# Patient Record
Sex: Male | Born: 1960
Health system: Southern US, Community
[De-identification: ages and names within clinical notes are randomized; demographics above are authoritative.]

## PROBLEM LIST (undated history)

## (undated) DIAGNOSIS — R310 Gross hematuria: Secondary | ICD-10-CM

## (undated) DIAGNOSIS — R079 Chest pain, unspecified: Secondary | ICD-10-CM

## (undated) DIAGNOSIS — E7800 Pure hypercholesterolemia, unspecified: Secondary | ICD-10-CM

## (undated) DIAGNOSIS — N201 Calculus of ureter: Secondary | ICD-10-CM

## (undated) DIAGNOSIS — N200 Calculus of kidney: Secondary | ICD-10-CM

## (undated) DIAGNOSIS — I2699 Other pulmonary embolism without acute cor pulmonale: Secondary | ICD-10-CM

## (undated) DIAGNOSIS — I82409 Acute embolism and thrombosis of unspecified deep veins of unspecified lower extremity: Secondary | ICD-10-CM

## (undated) HISTORY — DX: Gross hematuria: R31.0

## (undated) HISTORY — DX: Calculus of ureter: N20.1

## (undated) HISTORY — DX: Morbid (severe) obesity due to excess calories: E66.01

## (undated) HISTORY — DX: Chest pain, unspecified: R07.9

---

## 2003-02-11 ENCOUNTER — Emergency Department (HOSPITAL_COMMUNITY): Admission: EM | Admit: 2003-02-11 | Discharge: 2003-02-11 | Payer: Self-pay | Admitting: Emergency Medicine

## 2003-02-11 ENCOUNTER — Ambulatory Visit (HOSPITAL_COMMUNITY): Admission: RE | Admit: 2003-02-11 | Discharge: 2003-02-11 | Payer: Self-pay | Admitting: Cardiology

## 2003-02-11 ENCOUNTER — Encounter (INDEPENDENT_AMBULATORY_CARE_PROVIDER_SITE_OTHER): Payer: Self-pay | Admitting: Cardiology

## 2004-01-27 ENCOUNTER — Encounter (INDEPENDENT_AMBULATORY_CARE_PROVIDER_SITE_OTHER): Payer: Self-pay | Admitting: Cardiology

## 2004-01-27 ENCOUNTER — Ambulatory Visit (HOSPITAL_COMMUNITY): Admission: RE | Admit: 2004-01-27 | Discharge: 2004-01-27 | Payer: Self-pay | Admitting: Cardiology

## 2005-04-15 ENCOUNTER — Encounter: Admission: RE | Admit: 2005-04-15 | Discharge: 2005-04-15 | Payer: Self-pay | Admitting: Cardiology

## 2009-12-05 ENCOUNTER — Ambulatory Visit (HOSPITAL_COMMUNITY): Admission: RE | Admit: 2009-12-05 | Discharge: 2009-12-05 | Payer: Self-pay | Admitting: Urology

## 2009-12-07 ENCOUNTER — Ambulatory Visit (HOSPITAL_COMMUNITY): Admission: RE | Admit: 2009-12-07 | Discharge: 2009-12-07 | Payer: Self-pay | Admitting: Urology

## 2010-05-14 ENCOUNTER — Emergency Department (HOSPITAL_COMMUNITY)
Admission: EM | Admit: 2010-05-14 | Discharge: 2010-05-14 | Payer: Self-pay | Source: Home / Self Care | Admitting: Emergency Medicine

## 2010-07-23 LAB — URINE MICROSCOPIC-ADD ON

## 2010-07-23 LAB — URINALYSIS, ROUTINE W REFLEX MICROSCOPIC
Specific Gravity, Urine: 1.028 (ref 1.005–1.030)
Urobilinogen, UA: 0.2 mg/dL (ref 0.0–1.0)
pH: 5.5 (ref 5.0–8.0)

## 2010-11-23 ENCOUNTER — Encounter (HOSPITAL_COMMUNITY): Payer: Self-pay

## 2010-12-19 ENCOUNTER — Encounter (INDEPENDENT_AMBULATORY_CARE_PROVIDER_SITE_OTHER): Payer: 59

## 2010-12-19 DIAGNOSIS — M79609 Pain in unspecified limb: Secondary | ICD-10-CM

## 2010-12-31 NOTE — Procedures (Unsigned)
DUPLEX DEEP VENOUS EXAM - LOWER EXTREMITY  INDICATION:  Left lower extremity pain/swelling, rule out DVT.  HISTORY:  Edema:  Left lower extremity swelling for 3 days. Trauma/Surgery:  Patient states a torn cartilage in the left knee. Pain:  Left knee joint pain. PE:  No. Previous DVT:  No. Anticoagulants: Other:  DUPLEX EXAM:               CFV   SFV   PopV  PTV    GSV               R  L  R  L  R  L  R   L  R  L Thrombosis    o  o     o     +      +     o Spontaneous   +  +     +     o      o     + Phasic        +  +     +     o      o     + Augmentation  +  +     +     o      o     + Compressible  +  +     +     o      o     + Competent  Legend:  + - yes  o - no  p - partial  D - decreased  IMPRESSION: 1. The left popliteal and one of the left posterior tibial veins     appears to contain totally occlusive thrombus. 2. No evidence of deep or superficial venous thrombosis noted in the     remainder of the veins of the left lower extremity. The left     peroneal vein was not adequately visualized.  Preliminary findings stating the occlusion of the left popliteal and posterior tibial veins were called to Dr. Laurey Morale office on 12/19/2010.   _____________________________ Jeffery Rocha. Hart Rochester, M.D.  CH/MEDQ  D:  12/19/2010  T:  12/19/2010  Job:  [id number]

## 2011-01-18 ENCOUNTER — Ambulatory Visit
Admission: RE | Admit: 2011-01-18 | Discharge: 2011-01-18 | Disposition: A | Discharge status: Home / Self Care | Payer: 59 | Source: Ambulatory Visit | Attending: Cardiology | Admitting: Cardiology

## 2011-01-18 ENCOUNTER — Inpatient Hospital Stay (HOSPITAL_COMMUNITY)
Admission: EM | Admit: 2011-01-18 | Discharge: 2011-01-21 | DRG: 312 | Disposition: A | Discharge status: Home / Self Care | Payer: 59 | Attending: Internal Medicine | Admitting: Internal Medicine

## 2011-01-18 ENCOUNTER — Other Ambulatory Visit: Payer: Self-pay | Admitting: Cardiology

## 2011-01-18 DIAGNOSIS — I059 Rheumatic mitral valve disease, unspecified: Secondary | ICD-10-CM | POA: Diagnosis present

## 2011-01-18 DIAGNOSIS — I498 Other specified cardiac arrhythmias: Secondary | ICD-10-CM | POA: Diagnosis present

## 2011-01-18 DIAGNOSIS — R55 Syncope and collapse: Principal | ICD-10-CM | POA: Diagnosis present

## 2011-01-18 DIAGNOSIS — I712 Thoracic aortic aneurysm, without rupture: Secondary | ICD-10-CM

## 2011-01-18 DIAGNOSIS — E785 Hyperlipidemia, unspecified: Secondary | ICD-10-CM | POA: Diagnosis present

## 2011-01-18 DIAGNOSIS — Z7901 Long term (current) use of anticoagulants: Secondary | ICD-10-CM

## 2011-01-18 DIAGNOSIS — I10 Essential (primary) hypertension: Secondary | ICD-10-CM | POA: Diagnosis present

## 2011-01-18 DIAGNOSIS — I2782 Chronic pulmonary embolism: Secondary | ICD-10-CM | POA: Diagnosis present

## 2011-01-18 DIAGNOSIS — I824Z9 Acute embolism and thrombosis of unspecified deep veins of unspecified distal lower extremity: Secondary | ICD-10-CM | POA: Diagnosis present

## 2011-01-18 LAB — TROPONIN I: Troponin I: <0.3 ng/mL (ref <0.30)

## 2011-01-18 LAB — DIFFERENTIAL
Basophils Absolute: 0 10*3/uL (ref 0.0–0.1)
Eosinophils Absolute: 0.3 10*3/uL (ref 0.0–0.7)
Eosinophils Relative: 3 % (ref 0–5)

## 2011-01-18 LAB — CBC
Platelets: 187 10*3/uL (ref 150–400)
RDW: 13.9 % (ref 11.5–15.5)
WBC: 8.6 10*3/uL (ref 4.0–10.5)

## 2011-01-18 LAB — COMPREHENSIVE METABOLIC PANEL
ALT: 30 U/L (ref 0–53)
AST: 18 U/L (ref 0–37)
Calcium: 9.4 mg/dL (ref 8.4–10.5)
GFR calc Af Amer: >60 mL/min (ref >60)
Sodium: 137 mEq/L (ref 135–145)
Total Protein: 7.2 g/dL (ref 6.0–8.3)

## 2011-01-18 LAB — PROTIME-INR
INR: 1.76 — ABNORMAL HIGH (ref 0.00–1.49)
Prothrombin Time: 20.8 seconds — ABNORMAL HIGH (ref 11.6–15.2)

## 2011-01-18 LAB — CK TOTAL AND CKMB (NOT AT ARMC): Relative Index: 1.8 (ref 0.0–2.5)

## 2011-01-18 MED ORDER — IOHEXOL 300 MG/ML  SOLN
125.0000 mL | Freq: Once | INTRAMUSCULAR | Status: AC | PRN
  Administered 2011-01-18: 125 mL via INTRAVENOUS

## 2011-01-19 LAB — GLUCOSE, CAPILLARY: Glucose-Capillary: 102 mg/dL — ABNORMAL HIGH (ref 70–99)

## 2011-01-19 LAB — BASIC METABOLIC PANEL
BUN: 10 mg/dL (ref 6–23)
CO2: 30 mEq/L (ref 19–32)
Calcium: 9 mg/dL (ref 8.4–10.5)
Creatinine, Ser: 0.89 mg/dL (ref 0.50–1.35)
Glucose, Bld: 121 mg/dL — ABNORMAL HIGH (ref 70–99)

## 2011-01-19 LAB — PROTIME-INR: Prothrombin Time: 21.6 seconds — ABNORMAL HIGH (ref 11.6–15.2)

## 2011-01-19 LAB — CBC
Hemoglobin: 13.5 g/dL (ref 13.0–17.0)
MCH: 29.7 pg (ref 26.0–34.0)
MCHC: 35 g/dL (ref 30.0–36.0)

## 2011-01-19 LAB — HEPARIN LEVEL (UNFRACTIONATED): Heparin Unfractionated: 0.36 IU/mL (ref 0.30–0.70)

## 2011-01-20 LAB — CBC
HCT: 41.6 % (ref 39.0–52.0)
Hemoglobin: 14.7 g/dL (ref 13.0–17.0)
MCH: 30.2 pg (ref 26.0–34.0)
MCHC: 34.5 g/dL (ref 30.0–36.0)
MCV: 85.4 fL (ref 78.0–100.0)
Platelets: 170 10*3/uL (ref 150–400)
RBC: 4.87 MIL/uL (ref 4.22–5.81)
RDW: 14.1 % (ref 11.5–15.5)
WBC: 6.8 10*3/uL (ref 4.0–10.5)

## 2011-01-20 LAB — HEPARIN LEVEL (UNFRACTIONATED): Heparin Unfractionated: 0.52 IU/mL (ref 0.30–0.70)

## 2011-01-20 LAB — BASIC METABOLIC PANEL
Calcium: 9.2 mg/dL (ref 8.4–10.5)
Creatinine, Ser: 0.87 mg/dL (ref 0.50–1.35)
GFR calc Af Amer: >60 mL/min (ref >60)
GFR calc non Af Amer: >60 mL/min (ref >60)
Sodium: 140 mEq/L (ref 135–145)

## 2011-01-20 LAB — GLUCOSE, CAPILLARY: Glucose-Capillary: 118 mg/dL — ABNORMAL HIGH (ref 70–99)

## 2011-01-21 LAB — PROTIME-INR
INR: 1.91 — ABNORMAL HIGH (ref 0.00–1.49)
Prothrombin Time: 22.2 seconds — ABNORMAL HIGH (ref 11.6–15.2)

## 2011-01-21 LAB — BASIC METABOLIC PANEL
Calcium: 8.8 mg/dL (ref 8.4–10.5)
GFR calc Af Amer: >60 mL/min (ref >60)
GFR calc non Af Amer: >60 mL/min (ref >60)
Glucose, Bld: 90 mg/dL (ref 70–99)
Sodium: 139 mEq/L (ref 135–145)

## 2011-01-21 LAB — CBC
Hemoglobin: 13.6 g/dL (ref 13.0–17.0)
MCH: 29.8 pg (ref 26.0–34.0)
Platelets: 172 10*3/uL (ref 150–400)
RBC: 4.56 MIL/uL (ref 4.22–5.81)
WBC: 6.7 10*3/uL (ref 4.0–10.5)

## 2011-01-21 LAB — HEPARIN LEVEL (UNFRACTIONATED): Heparin Unfractionated: 0.28 IU/mL — ABNORMAL LOW (ref 0.30–0.70)

## 2011-01-22 NOTE — H&P (Signed)
NAME:  Jeffery Rocha, Jeffery Rocha NO.:  [id number]  MEDICAL RECORD NO.:  [id number]  LOCATION:  [facility]                         FACILITY:  [facility]  PHYSICIAN:  Kari Baars, M.D.  DATE OF BIRTH:  09/09/1960  DATE OF ADMISSION:  01/18/2011 DATE OF DISCHARGE:                             HISTORY & PHYSICAL   CHIEF COMPLAINT:  Dizziness and blood clot seen on chest CT.  HISTORY OF PRESENT ILLNESS:  Jeffery Rocha is a 50 year old white male with a history of hypertension, hyperlipidemia, and mitral valve prolapse with recently diagnosed left lower extremity DVT.  He presented to the emergency department from the Radiology Department after a CT performed today revealed chronic bilateral pulmonary emboli.  The patient was recently diagnosed with a left lower extremity DVT on December 19, 2010, following a knee injury and knee injection.  He has been managed with Coumadin since then.  He did have a negative hypercoagulable evaluation as an outpatient and has been followed regularly in our office with his most recent INR being 1.8 with dose adjustment.  Around the time of his DVT diagnosis, the patient began having transient "spells" of lightheadedness.  He describes these as brief episodes where he feels very lightheaded as if he feels like he may pass out.  They come on suddenly and pass over him like a wave and then come out through his eyes.  They are followed by a period of anxiety and last anywhere from 2-5 minutes.  He states that they most often occur when he is driving in the morning on the way to work, but they can occur at any time.  His most recent episode was about 3 days ago and that indeed occurred in the afternoon.  He has not had any exertional symptoms.  He was actually seen in our office for the similar symptoms several weeks ago.  At that time, we did an outpatient carotid study that showed less than 50% stenosis.  He was referred to Dr. Donnie Aho, who he saw  yesterday.  Dr. Donnie Aho was concerned about the possibility of a PE and referred him for a CT angiogram which was performed this afternoon and showed chronic bilateral PEs.  When he was informed about the report, the patient was sent to the emergency department for evaluation and admission by his primary care physician. Thus, we were called.  The patient does deny any chest pain including pleuritic-type chest pain.  He has not had any shortness of breath. Denies any palpitations.  His symptoms are clearly not related to any type of exertion or position change.  He has been eating and drinking well.  He does have a history of mitral valve prolapse and is followed annually with echocardiograms by Dr. Donnie Aho with his most recent one being about a year ago.  PAST MEDICAL HISTORY: 1. Hypertension. 2. Mitral valve prolapse with frequent PVCs. 3. Impaired fasting glucose. 4. History of nicotine addiction, having quit about 31 days ago. 5. Hypogonadism, on testosterone replacement. 6. Chronic fatigue. 7. Hyperlipidemia. 8. Nephrolithiasis (2012). 9. Left lower extremity DVT (December 23, 2010) related to a left knee     injury/injection. 10.Right carotid bruit with carotid Doppler showing  less than 50%     stenosis in (December 23, 2010).  CURRENT MEDICATIONS: 1. Bystolic 10 mg one-half daily. 2. Axiron 5 g daily. 3. Coumadin 10 mg 1/2 pill daily.  ALLERGIES:  No known drug allergies.  SOCIAL HISTORY:  He is married with two sons.  He has a high school education and works in [employer] with [employer].  He smoked 1-2 packs per day for more than 30 years, but quit about a month ago.  No alcohol or drug use.  FAMILY HISTORY:  Father died of heart disease and an MI at 50 and had diabetes.  His mother has had arthritis and arrhythmias.  Paternal grandmother had a stroke.  Sister with hyperlipidemia.  He has 2sons with ADD.  PHYSICAL EXAMINATION:  VITAL SIGNS:  Temperature 98.8, blood  pressure 140/89, pulse 75, respirations 20, oxygen saturation 98% on room air. Orthostatic blood pressures were 130/79 with a pulse of 82 lying, 118/77 with a pulse of 72 sitting, and 133/81 with a pulse of 81 standing. GENERAL:  He is obese in no acute distress. HEENT:  Oropharynx is moist.  Pupils equal, round, and reactive to light.  Extraocular movements intact.  Oropharynx is moist.  NECK: Supple without lymphadenopathy.  He does have a right carotid bruit. HEART:  Regular rate and rhythm without murmurs, rubs, or gallops. LUNGS:  Clear to auscultation bilaterally. ABDOMEN:  Soft, nondistended, nontender with normoactive bowel sounds. EXTREMITIES:  No clubbing, cyanosis, or edema. NEUROLOGIC:  Alert and oriented x4.  Nonfocal motor exam.  LABS:  CBC shows white blood count of 8.6, hemoglobin 15, platelets 187. BMET shows sodium 137, potassium 2.8, chloride 102, bicarb 26, BUN 9, creatinine 0.84, glucose 114.  Liver function tests are normal.  INR 1.76.  STUDIES: 1. CT of the chest with contrast reveals several ill-defined low     density foci in the pulmonary arterial tree concerning for minimal     residual chronic pulmonary embolism, but no evidence of acute     pulmonary embolism.  Aorta is dilated to the level of the Valsalva.     Moderate calcification in the LAD and mild right coronary artery     calcification. 2. EKG personally reviewed shows sinus rhythm with sinus arrhythmia     and PVCs.  RSR prime pattern.  ASSESSMENT AND PLAN: 1. Recurrent near syncope/lightheadedness - he has transient episodes     that occur, most often at rest and sound more likely related to     cardiac dysrhythmia such as frequent premature ventricular     contractions in the setting of his mitral valve prolapse rather     than related to chronic pulmonary embolisms.  These pulmonary     embolism could of course be proarrhythmic, but I do not think that     they are causing any pulmonary  symptoms as he has not had any     pleuritic chest pain, shortness of breath, or hypoxia.  His     symptoms are clearly not exertional and do not sound ischemic in     nature.  He will be admitted to telemetry.  We will obtain an     echocardiogram.  Consider Cardiology evaluation.  We will obtain     orthostatics, blood pressure readings, blood sugar, and telemetry     reports if he has recurrent episodes.  If we cannot capture an     episode during this hospitalization, we will consider an outpatient  Holter monitor. 2. Bilateral chronic pulmonary embolism - I suspect that these     pulmonary embolism are related to his original deep vein thrombosis     occurring about a month ago and are not evidence of "Coumadin     failure."  He did have a hypercoagulable panel was done in the     office which was negative.  I would not change management at this     point other than to continue his heparin drip until his INR is     greater than 2 and then continue Coumadin for 3-6 months for his     initial pulmonary embolism. 3. Hypertension - continue Bystolic for now.  If he has more frequent     premature ventricular contractions,     consider an alternative beta-blocker, although fatigue has been an     issue for him in the past. 4. Disposition - I will anticipate discharge in 2-3 days once his INR     is therapeutic and his evaluation has been completed.     Kari Baars, M.D.     WS/MEDQ  D:  01/18/2011  T:  01/18/2011  Job:  [id number]  cc:   Georga Hacking, M.D.  Electronically Signed by Lacretia Nicks. Buren Kos M.D. on 01/22/2011 09:43:09 PM

## 2011-01-22 NOTE — Discharge Summary (Signed)
NAMEMarland Kitchen  Jeffery Rocha, Jeffery Rocha NO.:  [id number]  MEDICAL RECORD NO.:  [id number]  LOCATION:  4714                         FACILITY:  [facility]  PHYSICIAN:  Kari Baars, M.D.  DATE OF BIRTH:  05/12/1961  DATE OF ADMISSION:  01/18/2011 DATE OF DISCHARGE:  01/21/2011                              DISCHARGE SUMMARY   DISCHARGE DIAGNOSES: 1. Near syncope/dizziness, recurrent. 2. Bilateral pulmonary emboli, chronic appearing. 3. Recent left lower extremity deep vein thrombosis (December 23, 2010). 4. Bradycardia. 5. Hypertension. 6. Mitral valve prolapse with frequent premature ventricular     contractions. 7. Impaired fasting glucose. 8. Hypogonadism on testosterone replacement. 9. Hyperlipidemia. 10.Right carotid bruit with carotid Dopplers showing less than 50%     stenosis in December 23, 2010. 11.Nicotine addiction, in remission.  DISCHARGE MEDICATIONS: 1. Coumadin 5 mg 1-1/2 pills on Monday and 1 pill on other days -     obtain INR within 1 week at [facility]. 2. Axiron 5 g topically daily. 3. Stop Bystolic.  HOSPITAL PROCEDURES: 1. CT angiogram of the chest (January 18, 2011,) several ill-defined     low density foci in the pulmonary arterial tree suspicious for     minimal residual of chronic pulmonary embolus but no filling     defects characteristic of acute pulmonary embolus are observed.     Aorta dilated to the level of the sinuses of Valsalva, otherwise     normal.  Moderate amount of coronary artery atherosclerotic     calcification in the left anterior descending coronary artery and     mild calcification in the right coronary artery.  Central lobular     emphysema. 2. Echocardiogram - results pending. 3. Telemetry monitoring.  HISTORY OF PRESENT ILLNESS:  For full details, please see dictated history and physical.  But, Jeffery Rocha is a 50 year old white male with a history of hypertension, hyperlipidemia, and mitral  valve prolapse who was recently diagnosed with a left lower extremity DVT.  He presented to the emergency department from the Radiology Department after having a CT  performed as an outpatient revealing chronic bilateral pulmonary emboli.  It was recommended by Dr. Donnie Aho that he go to the emergency room.  The patient has recently had a left lower extremity DVT which was diagnosed on December 19, 2010, following a knee injury and injection.  He has been on anticoagulation since that time with his most recent INR being 1.8.  His dose was adjusted at that time. Around the time that he was diagnosed with a DVT, started having some transient "spells" of lightheadedness described as feeling as if he may pass out.  He states they come on him like a wave and last 2-5 minutes and most often occur when he is driving on the way to work in the morning.  He has not had an episode within about 3 days prior to admission but had been seen in our office.  He had a carotid Doppler that showed less than 50% stenosis.  He was referred to Dr. Donnie Aho who saw the patient and was concerned about the possibility of a PE and referred him for a CT angiogram that showed bilateral pulmonary  emboli. Upon review of this, Dr. Donnie Aho recommended that he come to the emergency department to be admitted by primary care.  The patient denied any chest pain or shortness of breath.  His oxygen saturations were 98% on room air on admission.  INR was 1.76.  EKG showed sinus rhythm with frequent PVCs and an RSR prime pattern.  The patient was admitted for further management of dizziness/lightheadedness and chronic bilateral PEs.  HOSPITAL COURSE:  The patient was admitted to a telemetry bed.  I do not feel that his dizziness is directly related to these PE and these are more reflective of prior embolic disease at the time of his DVT.  They are chronic-appearing in nature on CT and appear to be resolving with anticoagulation in  time.  He is not having any pulmonary symptoms to indicate that these are significant.  More likely, his recurrent near syncope/dizziness is related to his Bystolic use and resulting hypotension and bradycardia.  He did have multiple PVCs noted on the heart monitor some of which seemed to correlate with his symptoms. However, most impressive was blood pressure into the 90s at the time of his hypertensive spells.  He also had bradycardia on telemetry with rates down to the 40s even after discontinuing his Bystolic.  We did decide to discontinue his Bystolic and monitor him for 24 hours.  He continued to have some lightheadedness and this will be monitored further as an outpatient.  For his chronic pulmonary emboli, I did place him on heparin due to a subtherapeutic INR.  His INR is trended up to 1.91.  I will increase his Coumadin dose slightly on discharge and give him a shot of Lovenox prior to discharge through the day today.  Again, these thoughts were not felt to be causative of his symptoms.  An echocardiogram was performed and has been pending review by Cardiology.  Assuming that it is normal, the next step would likely be an event monitor as an outpatient to determine specifically if his events are related to bradycardia or PVCs.  If he does have increasing PVCs, consideration of a lower potency beta blocker such as propranolol would be reasonable.  At this point, the patient is stable for discharge home with close outpatient followup.  DISCHARGE LABORATORY DATA:  CBC shows a white count of 6.7, hemoglobin 13.6, platelets 172, INR 1.91.  BMET significant for sodium 139, potassium 3.8, chloride 105, bicarb 29, BUN 8, creatinine 0.83, glucose 90.  Cardiac enzymes on admission were normal with a troponin of less than 0.30.  DISCHARGE DIET:  Cardiac prudent.  HOSPITAL FOLLOWUP:  The patient should follow up with Dr. Clelia Croft within the next week for a repeat INR and to readdress  this issue.  I will discuss with Dr. Donnie Aho, and follow up with Dr. Donnie Aho for consideration of an event monitor.  DISCHARGE INSTRUCTIONS:  He should call if he has worsening episodes of near syncope or if he has a syncopal spell.  I have advised caution when driving and working due to the frequency of these events.  DISPOSITION:  To home.  CONDITION ON DISCHARGE:  Stable.     Kari Baars, M.D.     WS/MEDQ  D:  01/21/2011  T:  01/21/2011  Job:  [id number]  cc:   Georga Hacking, M.D.  Electronically Signed by Lacretia Nicks. Buren Kos M.D. on 01/22/2011 09:43:29 PM

## 2011-02-13 NOTE — Consult Note (Signed)
NAMEMarland Kitchen  Jeffery Rocha, Jeffery Rocha NO.:  [id number]  MEDICAL RECORD NO.:  [id number]  LOCATION:  4714                         FACILITY:  [facility]  PHYSICIAN:  Georga Hacking, M.D.DATE OF BIRTH:  December 29, 1960  DATE OF CONSULTATION:  01/21/2011                                 CONSULTATION   I was asked to see this patient because of evaluation of dizziness.  He has been a longstanding patient of mine and I have followed him through the years because of a mildly dilated aortic root and some mild mitral valve prolapse.  He has smoked for several years.  He was recently diagnosed with episodes of DVT in August related to left knee injury or infection.  He was also found to have a carotid bruit and had a carotid Doppler study showing a stenosis of less than 50%.  He came to see me in the office complaining of dizziness where it felt as a way of being lightheaded like he might pass out around the time of his diagnosis of DVT.  He came to see me because of recurrent dizziness and EKG was unremarkable and he was not having ischemic chest pain, but I ordered a CT angiogram on him to look for coronary calcification, to look for PE, and to evaluate his aortic root.  The sinus Valsalva was dilated at 4.6 cm.  The remaining aorta looked good.  He had bilateral pulmonary emboli noted on CT angiogram and was admitted.  Since admission, he has had some occasional dizziness and his blood pressure has been running around 111.  He has had episodic bradycardia at night going down to 40.  His Bystolic was stopped.  He does not have any ischemic-type chest pain and does have coronary calcification involving the LAD as well as the right coronary artery.  PAST HISTORY:  Remarkable for: 1. Hypertension. 2. PVCs. 3. Mitral valve prolapse. 4. Impaired fasting glucose. 5. Hypogonadism. 6. Some fatigue. 7. Hyperlipidemia in the past. 8. Previous nephrolithiasis and previous lower extremity DVT. 9.  Carotid artery disease.  SOCIAL HISTORY:  She is married, with 2 sons.  He quit smoking about 1 month ago.  He works doing Nurse, children's.  FAMILY HISTORY:  Positive for heart disease in his father at age 60. Mother had arthritis.  Sister has hyperlipidemia.  PHYSICAL EXAMINATION:  GENERAL:  A pleasant male, currently in no acute distress. VITAL SIGNS:  Blood pressure 111/80, pulse currently 64. LUNGS:  Clear. NECK:  Right carotid bruit noted. CARDIAC:  Normal S1 and S2.  No S3. ABDOMEN:  Soft and nontender, is obese.  EKG shows sinus rhythm with PVCs, no acute ischemic ST changes, left atrial enlargement, incomplete right bundle-branch block.  IMPRESSION: 1. Dizziness, probably multifactorial.  The onset was around the time     of his diagnosis of deep vein thrombosis and may have been due to     pulmonary emboli. 2. Bradycardia, on Bystolic.  Primary indication for this was dilated     aorta, possible premature ventricular contractions. 3. Coronary artery disease as demonstrated by coronary calcifications     on CT scan. 4. Obesity. 5. Carotid artery disease with right carotid  bruits. 6. Hyperlipidemia.  RECOMMENDATIONS:  At this point, I think the patient can go home.  He should stop Bystolic, but if he continues complain of dizziness we would recommend an outpatient event monitor.  I reviewed his echocardiogram showing mild LVH with normal systolic function, mildly dilated sinus Valsalva in the aortic root.  I did not see mitral valve prolapse in this particular echo.  His right ventricle was not enlarged I would recommend treatment with an ACE inhibitor if his blood pressure will tolerate it once gets over the PE for atherosclerotic prevention and also lipid treatments for an LDL of less than 70.  Recommended he will follow up with me for a Cardiolite stress test for risk stratification in light of his coronary calcifications and symptoms.     Georga Hacking,  M.D.     WST/MEDQ  D:  01/21/2011  T:  01/21/2011  Job:  [id number]  Electronically Signed by W. Donnie Aho M.D. on 02/13/2011 12:34:32 PM

## 2011-05-19 ENCOUNTER — Emergency Department (HOSPITAL_COMMUNITY): Payer: 59

## 2011-05-19 ENCOUNTER — Emergency Department (HOSPITAL_COMMUNITY)
Admission: EM | Admit: 2011-05-19 | Discharge: 2011-05-19 | Disposition: A | Discharge status: Home / Self Care | Payer: 59 | Attending: Emergency Medicine | Admitting: Emergency Medicine

## 2011-05-19 ENCOUNTER — Encounter: Payer: Self-pay | Admitting: Emergency Medicine

## 2011-05-19 DIAGNOSIS — E7800 Pure hypercholesterolemia, unspecified: Secondary | ICD-10-CM | POA: Insufficient documentation

## 2011-05-19 DIAGNOSIS — R61 Generalized hyperhidrosis: Secondary | ICD-10-CM | POA: Insufficient documentation

## 2011-05-19 DIAGNOSIS — Z79899 Other long term (current) drug therapy: Secondary | ICD-10-CM | POA: Insufficient documentation

## 2011-05-19 DIAGNOSIS — M545 Low back pain, unspecified: Secondary | ICD-10-CM | POA: Insufficient documentation

## 2011-05-19 DIAGNOSIS — R6883 Chills (without fever): Secondary | ICD-10-CM | POA: Insufficient documentation

## 2011-05-19 DIAGNOSIS — N201 Calculus of ureter: Secondary | ICD-10-CM

## 2011-05-19 DIAGNOSIS — R110 Nausea: Secondary | ICD-10-CM | POA: Insufficient documentation

## 2011-05-19 DIAGNOSIS — R319 Hematuria, unspecified: Secondary | ICD-10-CM | POA: Insufficient documentation

## 2011-05-19 DIAGNOSIS — R109 Unspecified abdominal pain: Secondary | ICD-10-CM | POA: Insufficient documentation

## 2011-05-19 DIAGNOSIS — Z7901 Long term (current) use of anticoagulants: Secondary | ICD-10-CM | POA: Insufficient documentation

## 2011-05-19 DIAGNOSIS — Z86718 Personal history of other venous thrombosis and embolism: Secondary | ICD-10-CM | POA: Insufficient documentation

## 2011-05-19 DIAGNOSIS — N1330 Unspecified hydronephrosis: Secondary | ICD-10-CM | POA: Insufficient documentation

## 2011-05-19 HISTORY — DX: Other pulmonary embolism without acute cor pulmonale: I26.99

## 2011-05-19 HISTORY — DX: Calculus of kidney: N20.0

## 2011-05-19 HISTORY — DX: Pure hypercholesterolemia, unspecified: E78.00

## 2011-05-19 LAB — URINALYSIS, ROUTINE W REFLEX MICROSCOPIC
Nitrite: POSITIVE — AB
Protein, ur: >300 mg/dL — AB
Urobilinogen, UA: 1 mg/dL (ref 0.0–1.0)

## 2011-05-19 LAB — DIFFERENTIAL
Eosinophils Absolute: 0.1 10*3/uL (ref 0.0–0.7)
Eosinophils Relative: 2 % (ref 0–5)
Lymphs Abs: 1.1 10*3/uL (ref 0.7–4.0)
Monocytes Relative: 13 % — ABNORMAL HIGH (ref 3–12)

## 2011-05-19 LAB — BASIC METABOLIC PANEL
BUN: 12 mg/dL (ref 6–23)
Calcium: 9.1 mg/dL (ref 8.4–10.5)
Creatinine, Ser: 1.05 mg/dL (ref 0.50–1.35)
GFR calc non Af Amer: 80 mL/min — ABNORMAL LOW (ref >90)
Glucose, Bld: 112 mg/dL — ABNORMAL HIGH (ref 70–99)

## 2011-05-19 LAB — URINE MICROSCOPIC-ADD ON

## 2011-05-19 LAB — CBC
Hemoglobin: 16.4 g/dL (ref 13.0–17.0)
MCH: 29.7 pg (ref 26.0–34.0)
MCV: 88 fL (ref 78.0–100.0)
Platelets: 187 10*3/uL (ref 150–400)
RBC: 5.52 MIL/uL (ref 4.22–5.81)

## 2011-05-19 MED ORDER — HYDROMORPHONE HCL PF 1 MG/ML IJ SOLN
1.0000 mg | Freq: Once | INTRAMUSCULAR | Status: AC
  Administered 2011-05-19: 1 mg via INTRAVENOUS
  Filled 2011-05-19: qty 1

## 2011-05-19 MED ORDER — SODIUM CHLORIDE 0.9 % IV SOLN
Freq: Once | INTRAVENOUS | Status: AC
  Administered 2011-05-19: 07:00:00 via INTRAVENOUS

## 2011-05-19 MED ORDER — ONDANSETRON HCL 4 MG/2ML IJ SOLN
4.0000 mg | Freq: Once | INTRAMUSCULAR | Status: AC
  Administered 2011-05-19: 4 mg via INTRAVENOUS
  Filled 2011-05-19: qty 2

## 2011-05-19 MED ORDER — TAMSULOSIN HCL 0.4 MG PO CAPS: 0.4000 mg | ORAL_CAPSULE | Freq: Every day | ORAL | Status: DC

## 2011-05-19 MED ORDER — HYDROMORPHONE HCL 2 MG PO TABS: 2.0000 mg | ORAL_TABLET | ORAL | Status: AC | PRN

## 2011-05-19 MED ORDER — CEPHALEXIN 500 MG PO CAPS: 500.0000 mg | ORAL_CAPSULE | Freq: Four times a day (QID) | ORAL | Status: AC

## 2011-05-19 NOTE — ED Notes (Signed)
Patient states has been taking coumadin x 7 months and today when he urinated it appeared to be heavy blood. Also reports right flank pain and lower abdominal pain has a history of kidney stones.

## 2011-05-19 NOTE — ED Notes (Signed)
PT. REPORTS HEMATURIA/ RIGHT FLANK PAIN ONSET THIS MORNING WITH CHILLS.

## 2011-05-19 NOTE — ED Provider Notes (Signed)
History     CSN: [id number]  Arrival date & time 05/19/11  0865   First MD Initiated Contact with Patient 05/19/11 0700      Chief Complaint  Patient presents with  . Hematuria    (Consider location/radiation/quality/duration/timing/severity/associated sxs/prior treatment) Patient is a 51 y.o. male presenting with hematuria. The history is provided by the patient.  Hematuria   he woke up this morning with severe right lower back pain radiating around to the right inguinal area and right groin. This is associated with hematuria. He is on Coumadin for pulmonary embolism. He's had a cold for the last several days and has had chills and sweats associated with that. He did wake up in a sweat this morning. Pain is described as sharp and severe. He rates it an 8/10. Nothing makes it better and nothing makes it worse. Unable to find a comfortable position. There has been associated nausea but no vomiting and no diarrhea. He has a history of a kidney stone and he was told that there was one stone that was still in his kidney that would not cause a problem until it moved.  Past Medical History  Diagnosis Date  . Kidney stones   . Hypercholesteremia   . Pulmonary embolism   . DVT (deep vein thrombosis) in pregnancy     History reviewed. No pertinent past surgical history.  No family history on file.  History  Substance Use Topics  . Smoking status: Former Games developer  . Smokeless tobacco: Not on file  . Alcohol Use: No      Review of Systems  Genitourinary: Positive for hematuria.  All other systems reviewed and are negative.    Allergies  Review of patient's allergies indicates no known allergies.  Home Medications   Current Outpatient Rx  Name Route Sig Dispense Refill  . AZITHROMYCIN 250 MG PO TABS Oral Take 250 mg by mouth daily. Pt has taken only 3 tablets     . ROSUVASTATIN CALCIUM 5 MG PO TABS Oral Take 5 mg by mouth daily.      . TESTOSTERONE 30 MG/ACT TD SOLN  Transdermal Place 30 mg onto the skin daily.      . WARFARIN SODIUM 5 MG PO TABS Oral Take 5 mg by mouth daily.        BP 118/85  Pulse 102  Temp(Src) 98.3 F (36.8 C) (Oral)  Resp 20  SpO2 95%  Physical Exam  Nursing note and vitals reviewed.  51 year old male who is pacing the floor and appears uncomfortable. Vital signs are significant for tachycardia with heart rate 102. Oxygen saturation is 95% which is normal. Cephalic and atraumatic. PERRLA, EOMI. Oropharynx is clear. Neck is supple without adenopathy. Back has no spinal tenderness. There is tenderness in the right paralumbar area just superior to the iliac crest. Lungs are clear without rales, wheezes, rhonchi. Heart has regular rate and rhythm without murmur. Abdomen is soft, flat, with mild to moderate tenderness in the right lower quadrant. There is no rebound or guarding. Peristalsis is diminished. Extremities have no cyanosis or edema, full range of motion present. Skin is warm and moist without rash. Neurologic: Mental status is normal, cranial nerves are intact, there no focal motor Center deficits. Psychiatric: No abnormalities of mood or affect.  ED Course  Procedures (including critical care time) Results for orders placed during the hospital encounter of 05/19/11  URINALYSIS, ROUTINE W REFLEX MICROSCOPIC      Component Value Range   Color, Urine  RED (*) YELLOW    APPearance TURBID (*) CLEAR    Specific Gravity, Urine 1.044 (*) 1.005 - 1.030    pH 5.5  5.0 - 8.0    Glucose, UA NEGATIVE  NEGATIVE (mg/dL)   Hgb urine dipstick LARGE (*) NEGATIVE    Bilirubin Urine LARGE (*) NEGATIVE    Ketones, ur 15 (*) NEGATIVE (mg/dL)   Protein, ur >161 (*) NEGATIVE (mg/dL)   Urobilinogen, UA 1.0  0.0 - 1.0 (mg/dL)   Nitrite POSITIVE (*) NEGATIVE    Leukocytes, UA MODERATE (*) NEGATIVE   URINE MICROSCOPIC-ADD ON      Component Value Range   WBC, UA 3-6  <3 (WBC/hpf)   RBC / HPF TOO NUMEROUS TO COUNT  <3 (RBC/hpf)   Urine-Other  MUCOUS PRESENT    CBC      Component Value Range   WBC 4.7  4.0 - 10.5 (K/uL)   RBC 5.52  4.22 - 5.81 (MIL/uL)   Hemoglobin 16.4  13.0 - 17.0 (g/dL)   HCT 09.6  04.5 - 40.9 (%)   MCV 88.0  78.0 - 100.0 (fL)   MCH 29.7  26.0 - 34.0 (pg)   MCHC 33.7  30.0 - 36.0 (g/dL)   RDW 81.1  91.4 - 78.2 (%)   Platelets 187  150 - 400 (K/uL)  DIFFERENTIAL      Component Value Range   Neutrophils Relative 61  43 - 77 (%)   Neutro Abs 2.9  1.7 - 7.7 (K/uL)   Lymphocytes Relative 24  12 - 46 (%)   Lymphs Abs 1.1  0.7 - 4.0 (K/uL)   Monocytes Relative 13 (*) 3 - 12 (%)   Monocytes Absolute 0.6  0.1 - 1.0 (K/uL)   Eosinophils Relative 2  0 - 5 (%)   Eosinophils Absolute 0.1  0.0 - 0.7 (K/uL)   Basophils Relative 1  0 - 1 (%)   Basophils Absolute 0.0  0.0 - 0.1 (K/uL)  BASIC METABOLIC PANEL      Component Value Range   Sodium 140  135 - 145 (mEq/L)   Potassium 4.1  3.5 - 5.1 (mEq/L)   Chloride 104  96 - 112 (mEq/L)   CO2 27  19 - 32 (mEq/L)   Glucose, Bld 112 (*) 70 - 99 (mg/dL)   BUN 12  6 - 23 (mg/dL)   Creatinine, Ser 9.56  0.50 - 1.35 (mg/dL)   Calcium 9.1  8.4 - 21.3 (mg/dL)   GFR calc non Af Amer 80 (*) >90 (mL/min)   GFR calc Af Amer >90  >90 (mL/min)  PROTIME-INR      Component Value Range   Prothrombin Time 21.9 (*) 11.6 - 15.2 (seconds)   INR 1.88 (*) 0.00 - 1.49    Ct Abdomen Pelvis Wo Contrast  05/19/2011  *RADIOLOGY REPORT*  Clinical Data: Hematuria, right flank pain  CT ABDOMEN AND PELVIS WITHOUT CONTRAST  Technique:  Multidetector CT imaging of the abdomen and pelvis was performed following the standard protocol without intravenous contrast.  Comparison: 08/28/2010.  Findings: Dependent atelectasis lung bases.  Unenhanced liver, spleen, pancreas, and adrenal glands are within normal limits.  Gallbladder is unremarkable.  No intrahepatic or extrahepatic ductal dilatation.  Left kidney is unremarkable.  No renal calculi bilaterally.  Mild right hydronephrosis.  No evidence of bowel  obstruction.  Normal appendix.  Atherosclerotic calcifications of the abdominal aorta and branch vessels.  No abdominopelvic ascites.  No suspicious abdominopelvic lymphadenopathy.  Prostate is unremarkable.  3 mm distal right ureteral calculus (series 2/image 84).  Bladder is underdistended.  Degenerative changes of the visualized thoracolumbar spine.  IMPRESSION: 3 mm distal right ureteral calculus with mild right hydronephrosis.  Original Report Authenticated By: Charline Bills, M.D.      No diagnosis found.  IV is started with normal saline and he is given hydromorphone and Zofran for pain with good relief of pain. Ketorolac is not given because he is on warfarin.  INR is minimally subtherapeutic. Small number of WBCs is noted on urinalysis, but it is on an unresponsive specimen. Therefore, I will start him empirically on antibiotics and a prescription is given for Keflex. Ciprofloxacin is avoided because of its severe interaction with warfarin. He'll be sent home with prescriptions for hydromorphone 2 mg to take every 4 hours as needed, Flomax 0.4 mg to take one a day for 5 days, and Keflex 500 mg #40 to take 4 times a day.  MDM  Renal colic most likely related to ureterolithiasis. Bleeding is likely augmented by being on anticoagulant. He will need to have his INR checked and CT scan will be obtained as well as urinalysis. Also need to rule out urinary tract infection. Prior ED visit was reviewed and he had been seen about one year ago for a kidney stone. Followup CT urogram was reviewed and he did have a 3.5 mm calculus in the right renal pelvis.       Dione Booze, MD 05/19/11 [phone]

## 2012-07-25 ENCOUNTER — Encounter (HOSPITAL_COMMUNITY): Payer: Self-pay | Admitting: Nurse Practitioner

## 2012-07-25 ENCOUNTER — Emergency Department (HOSPITAL_COMMUNITY)
Admission: EM | Admit: 2012-07-25 | Discharge: 2012-07-25 | Disposition: A | Discharge status: Home / Self Care | Payer: 59 | Attending: Emergency Medicine | Admitting: Emergency Medicine

## 2012-07-25 DIAGNOSIS — K921 Melena: Secondary | ICD-10-CM | POA: Insufficient documentation

## 2012-07-25 DIAGNOSIS — K625 Hemorrhage of anus and rectum: Secondary | ICD-10-CM | POA: Insufficient documentation

## 2012-07-25 DIAGNOSIS — Z79899 Other long term (current) drug therapy: Secondary | ICD-10-CM | POA: Insufficient documentation

## 2012-07-25 DIAGNOSIS — D689 Coagulation defect, unspecified: Secondary | ICD-10-CM | POA: Insufficient documentation

## 2012-07-25 DIAGNOSIS — E7800 Pure hypercholesterolemia, unspecified: Secondary | ICD-10-CM | POA: Insufficient documentation

## 2012-07-25 DIAGNOSIS — Z87442 Personal history of urinary calculi: Secondary | ICD-10-CM | POA: Insufficient documentation

## 2012-07-25 DIAGNOSIS — Z86718 Personal history of other venous thrombosis and embolism: Secondary | ICD-10-CM | POA: Insufficient documentation

## 2012-07-25 DIAGNOSIS — Z86711 Personal history of pulmonary embolism: Secondary | ICD-10-CM | POA: Insufficient documentation

## 2012-07-25 DIAGNOSIS — R42 Dizziness and giddiness: Secondary | ICD-10-CM | POA: Insufficient documentation

## 2012-07-25 DIAGNOSIS — Z87891 Personal history of nicotine dependence: Secondary | ICD-10-CM | POA: Insufficient documentation

## 2012-07-25 HISTORY — DX: Acute embolism and thrombosis of unspecified deep veins of unspecified lower extremity: I82.409

## 2012-07-25 LAB — COMPREHENSIVE METABOLIC PANEL
CO2: 26 mEq/L (ref 19–32)
Calcium: 9.3 mg/dL (ref 8.4–10.5)
Creatinine, Ser: 0.88 mg/dL (ref 0.50–1.35)
GFR calc Af Amer: >90 mL/min (ref >90)
GFR calc non Af Amer: >90 mL/min (ref >90)
Glucose, Bld: 99 mg/dL (ref 70–99)

## 2012-07-25 LAB — CBC
Hemoglobin: 16.6 g/dL (ref 13.0–17.0)
MCH: 31.1 pg (ref 26.0–34.0)
MCV: 86.9 fL (ref 78.0–100.0)
RBC: 5.33 MIL/uL (ref 4.22–5.81)

## 2012-07-25 LAB — TYPE AND SCREEN: Antibody Screen: NEGATIVE

## 2012-07-25 NOTE — ED Provider Notes (Signed)
History     CSN: [id number]  Arrival date & time 07/25/12  1340   First MD Initiated Contact with Patient 07/25/12 1504      Chief Complaint  Patient presents with  . Rectal Bleeding    (Consider location/radiation/quality/duration/timing/severity/associated sxs/prior treatment) Patient is a 52 y.o. male presenting with hematochezia. The history is provided by the patient. No language interpreter was used.  Rectal Bleeding  The current episode started today. The patient is experiencing no pain. Pertinent negatives include no fever, no abdominal pain, no nausea, no vomiting, no hematuria and no chest pain. Associated symptoms comments: He got up this morning and went to the bathroom for a bowel movement. He states he did not pass any stool but did pass a moderate amount of bright red blood into the toilet. He had a mild lightheadedness. No abdominal or rectal pain. He had a subsequent bowel movement that contained a smaller amount of blood and normal color stool. No N, V. He has had 2-3 episodes of seeing small amounts of blood in bowel movements in the last 2-3 weeks. Last colonoscopy was routine at age 79 (3 years ago) where benign polyps were found. He reports being on Xarelto for a history of DVT/PE in the setting of a "coagulation disorder"..    Past Medical History  Diagnosis Date  . Kidney stones   . Hypercholesteremia   . Pulmonary embolism   . DVT of lower limb, acute     History reviewed. No pertinent past surgical history.  History reviewed. No pertinent family history.  History  Substance Use Topics  . Smoking status: Former Games developer  . Smokeless tobacco: Not on file  . Alcohol Use: No      Review of Systems  Constitutional: Negative for fever and chills.  Respiratory: Negative.  Negative for shortness of breath.   Cardiovascular: Negative.  Negative for chest pain.  Gastrointestinal: Positive for blood in stool and hematochezia. Negative for nausea, vomiting and  abdominal pain.  Genitourinary: Negative for dysuria, hematuria and flank pain.  Musculoskeletal: Negative.  Negative for back pain.  Skin: Negative.  Negative for pallor.  Neurological: Positive for light-headedness.  Hematological: Bruises/bleeds easily.    Allergies  Review of patient's allergies indicates no known allergies.  Home Medications   Current Outpatient Rx  Name  Route  Sig  Dispense  Refill  . acetaminophen (TYLENOL) 500 MG tablet   Oral   Take 1,000 mg by mouth every 6 (six) hours as needed for pain.         . Rivaroxaban (XARELTO) 20 MG TABS   Oral   Take 20 mg by mouth daily.         . rosuvastatin (CRESTOR) 10 MG tablet   Oral   Take 10 mg by mouth daily.         . Testosterone (AXIRON) 30 MG/ACT SOLN   Transdermal   Place 30 mg onto the skin daily.            BP 123/80  Pulse 75  Temp(Src) 98.5 F (36.9 C) (Oral)  Resp 16  SpO2 96%  Physical Exam  Constitutional: He is oriented to person, place, and time. He appears well-developed and well-nourished. No distress.  HENT:  Head: Normocephalic.  Eyes: Conjunctivae are normal.  No conjunctival pallor.  Neck: Normal range of motion. Neck supple.  Cardiovascular: Normal rate and regular rhythm.   No murmur heard. Pulmonary/Chest: Effort normal and breath sounds normal. He has no  wheezes. He has no rales.  Abdominal: Soft. Bowel sounds are normal. There is no tenderness. There is no rebound and no guarding.  Musculoskeletal: Normal range of motion.  Neurological: He is alert and oriented to person, place, and time.  Skin: Skin is warm and dry. No rash noted.  Psychiatric: He has a normal mood and affect.    ED Course  Procedures (including critical care time)  Labs Reviewed  CBC  COMPREHENSIVE METABOLIC PANEL  APTT  PROTIME-INR  TYPE AND SCREEN   Results for orders placed during the hospital encounter of 07/25/12  CBC      Result Value Range   WBC 6.0  4.0 - 10.5 K/uL   RBC  5.33  4.22 - 5.81 MIL/uL   Hemoglobin 16.6  13.0 - 17.0 g/dL   HCT 11.9  14.7 - 82.9 %   MCV 86.9  78.0 - 100.0 fL   MCH 31.1  26.0 - 34.0 pg   MCHC 35.9  30.0 - 36.0 g/dL   RDW 56.2  13.0 - 86.5 %   Platelets 193  150 - 400 K/uL  COMPREHENSIVE METABOLIC PANEL      Result Value Range   Sodium 140  135 - 145 mEq/L   Potassium 4.2  3.5 - 5.1 mEq/L   Chloride 104  96 - 112 mEq/L   CO2 26  19 - 32 mEq/L   Glucose, Bld 99  70 - 99 mg/dL   BUN 10  6 - 23 mg/dL   Creatinine, Ser 7.84  0.50 - 1.35 mg/dL   Calcium 9.3  8.4 - 69.6 mg/dL   Total Protein 7.4  6.0 - 8.3 g/dL   Albumin 4.1  3.5 - 5.2 g/dL   AST 24  0 - 37 U/L   ALT 28  0 - 53 U/L   Alkaline Phosphatase 109  39 - 117 U/L   Total Bilirubin 0.5  0.3 - 1.2 mg/dL   GFR calc non Af Amer >90  >90 mL/min   GFR calc Af Amer >90  >90 mL/min  APTT      Result Value Range   aPTT 33  24 - 37 seconds  PROTIME-INR      Result Value Range   Prothrombin Time 14.1  11.6 - 15.2 seconds   INR 1.10  0.00 - 1.49  OCCULT BLOOD, POC DEVICE      Result Value Range   Fecal Occult Bld POSITIVE (*) NEGATIVE  TYPE AND SCREEN      Result Value Range   ABO/RH(D) A NEG     Antibody Screen NEG     Sample Expiration 07/28/2012      No results found.   No diagnosis found.  1. Rectal bleeding 2. coagulopathy  MDM  Records reviewed. No diagnosis of coagulation disorder, only to h/o PE/DVT. He is in NAD, strong hgb, not orthostatic. Discussed with Dr. Dulce Sellar to arrange close outpatient follow up and who advised the patient could be seen on Monday (in 2 days). Discussed with patient and family who are comfortable with discharge home.         Arnoldo Hooker, PA-C 07/25/12 1702

## 2012-07-25 NOTE — ED Provider Notes (Signed)
52 year old male with a history of hypercoagulable state requiring lifelong anticoagulant therapy presents with a complaint of 2 discrete bowel movements today that have blood in them. He states the first time it was just as small amount of watery stool which was read, he cannot characterize the redness of the stool, he did not have any abdominal pain but later in the day had a very small formed stool which appear to be light brown and had some bright red blood on it. He also noted some bright red blood on the toilet paper. He denies any rectal pain, rectal foreign bodies. He has been taking his medication exactly as prescribed, has no other sources of bleeding.  On exam the patient has a soft nontender abdomen with clear heart and lung sounds and appears to be in no acute distress. His vital signs do not reveal any tachycardia or hypotension and his hemoglobin is normal. The patient will need to have followup closely, at this time I think it is more important and the patient anticoagulated since he is not anemic and this may just be related to hemorrhoidal bleeding. The patient has been explained his findings, is amenable to followup as described, rectal exam performed by physician assistant Up[place], see her note.  Medical screening examination/treatment/procedure(s) were conducted as a shared visit with non-physician practitioner(s) and myself.  I personally evaluated the patient during the encounter    Vida Roller, MD 07/25/12 [phone]

## 2012-07-25 NOTE — ED Notes (Signed)
Pt reports dark red blood in urine with BM this am x 2. Just switched from coumadin to xarelto and PCP wanted pt to come to ed for eval. Pt c/o mild LLQ discomfort and lightheadedness. A&Ox4, resp e/u

## 2012-07-25 NOTE — ED Provider Notes (Signed)
Medical screening examination/treatment/procedure(s) were conducted as a shared visit with non-physician practitioner(s) and myself.  I personally evaluated the patient during the encounter  Please see my separate respective documentation pertaining to this patient encounter   Vida Roller, MD 07/25/12 2348

## 2012-07-27 ENCOUNTER — Other Ambulatory Visit: Payer: Self-pay | Admitting: Gastroenterology

## 2012-07-27 DIAGNOSIS — R1032 Left lower quadrant pain: Secondary | ICD-10-CM

## 2012-07-27 DIAGNOSIS — K625 Hemorrhage of anus and rectum: Secondary | ICD-10-CM

## 2012-07-28 ENCOUNTER — Ambulatory Visit
Admission: RE | Admit: 2012-07-28 | Discharge: 2012-07-28 | Disposition: A | Discharge status: Home / Self Care | Payer: 59 | Source: Ambulatory Visit | Attending: Gastroenterology | Admitting: Gastroenterology

## 2012-07-28 DIAGNOSIS — K625 Hemorrhage of anus and rectum: Secondary | ICD-10-CM

## 2012-07-28 DIAGNOSIS — R1032 Left lower quadrant pain: Secondary | ICD-10-CM

## 2012-07-28 MED ORDER — IOHEXOL 300 MG/ML  SOLN
125.0000 mL | Freq: Once | INTRAMUSCULAR | Status: AC | PRN
  Administered 2012-07-28: 125 mL via INTRAVENOUS

## 2014-06-01 ENCOUNTER — Other Ambulatory Visit: Payer: Self-pay | Admitting: Internal Medicine

## 2014-06-01 DIAGNOSIS — F17209 Nicotine dependence, unspecified, with unspecified nicotine-induced disorders: Secondary | ICD-10-CM

## 2014-07-08 ENCOUNTER — Ambulatory Visit
Admission: RE | Admit: 2014-07-08 | Discharge: 2014-07-08 | Disposition: A | Discharge status: Home / Self Care | Payer: No Typology Code available for payment source | Source: Ambulatory Visit | Attending: Internal Medicine | Admitting: Internal Medicine

## 2014-07-08 DIAGNOSIS — F17209 Nicotine dependence, unspecified, with unspecified nicotine-induced disorders: Secondary | ICD-10-CM

## 2015-07-08 ENCOUNTER — Other Ambulatory Visit: Payer: Self-pay | Admitting: Acute Care

## 2015-07-08 DIAGNOSIS — Z87891 Personal history of nicotine dependence: Secondary | ICD-10-CM

## 2015-07-21 ENCOUNTER — Ambulatory Visit: Payer: Self-pay

## 2015-08-04 ENCOUNTER — Ambulatory Visit: Payer: Self-pay

## 2015-08-18 ENCOUNTER — Ambulatory Visit
Admission: RE | Admit: 2015-08-18 | Discharge: 2015-08-18 | Disposition: A | Discharge status: Home / Self Care | Payer: BLUE CROSS/BLUE SHIELD | Source: Ambulatory Visit | Attending: Acute Care | Admitting: Acute Care

## 2015-08-18 DIAGNOSIS — L821 Other seborrheic keratosis: Secondary | ICD-10-CM | POA: Diagnosis not present

## 2015-08-18 DIAGNOSIS — Z87891 Personal history of nicotine dependence: Secondary | ICD-10-CM

## 2015-08-18 DIAGNOSIS — F17210 Nicotine dependence, cigarettes, uncomplicated: Secondary | ICD-10-CM | POA: Diagnosis not present

## 2015-08-18 DIAGNOSIS — L820 Inflamed seborrheic keratosis: Secondary | ICD-10-CM | POA: Diagnosis not present

## 2015-08-18 DIAGNOSIS — C44329 Squamous cell carcinoma of skin of other parts of face: Secondary | ICD-10-CM | POA: Diagnosis not present

## 2015-08-26 DIAGNOSIS — H40009 Preglaucoma, unspecified, unspecified eye: Secondary | ICD-10-CM | POA: Diagnosis not present

## 2015-09-16 ENCOUNTER — Telehealth: Payer: Self-pay | Admitting: Acute Care

## 2015-09-16 DIAGNOSIS — F17210 Nicotine dependence, cigarettes, uncomplicated: Secondary | ICD-10-CM

## 2015-09-16 NOTE — Telephone Encounter (Signed)
I called Jeffery Rocha with his low-dClovis Rocha CT screening results. I explained that the results indicated a lung RADS 2, nodules that are benign in both appearance and behavior. Recommendation is for follow-up CT scan in 12 months. We will schedule the CT for April 2018. We also discussed the fact that the scan revealed coronary artery atherosclerosis. The patient's cholesterol was checked annually by his primary care physician Dr. Martha ClanWilliam Rocha, and the patient is currently on Crestor. I will fax results of the scan to Jeffery Rocha for follow-up as he feels is clinically indicated. The patient verbalized understanding of the above and had no further questions upon ending the call.

## 2016-02-19 DIAGNOSIS — N200 Calculus of kidney: Secondary | ICD-10-CM | POA: Diagnosis not present

## 2016-02-19 DIAGNOSIS — N201 Calculus of ureter: Secondary | ICD-10-CM | POA: Diagnosis not present

## 2016-06-02 DIAGNOSIS — Z23 Encounter for immunization: Secondary | ICD-10-CM | POA: Diagnosis not present

## 2016-06-06 DIAGNOSIS — Z125 Encounter for screening for malignant neoplasm of prostate: Secondary | ICD-10-CM | POA: Diagnosis not present

## 2016-06-06 DIAGNOSIS — R8299 Other abnormal findings in urine: Secondary | ICD-10-CM | POA: Diagnosis not present

## 2016-06-06 DIAGNOSIS — R7301 Impaired fasting glucose: Secondary | ICD-10-CM | POA: Diagnosis not present

## 2016-06-06 DIAGNOSIS — I10 Essential (primary) hypertension: Secondary | ICD-10-CM | POA: Diagnosis not present

## 2016-06-06 DIAGNOSIS — Z0000 Encounter for general adult medical examination without abnormal findings: Secondary | ICD-10-CM | POA: Diagnosis not present

## 2016-06-10 DIAGNOSIS — J449 Chronic obstructive pulmonary disease, unspecified: Secondary | ICD-10-CM | POA: Diagnosis not present

## 2016-06-10 DIAGNOSIS — I10 Essential (primary) hypertension: Secondary | ICD-10-CM | POA: Diagnosis not present

## 2016-06-10 DIAGNOSIS — Z23 Encounter for immunization: Secondary | ICD-10-CM | POA: Diagnosis not present

## 2016-06-10 DIAGNOSIS — R7301 Impaired fasting glucose: Secondary | ICD-10-CM | POA: Diagnosis not present

## 2016-06-10 DIAGNOSIS — Z0000 Encounter for general adult medical examination without abnormal findings: Secondary | ICD-10-CM | POA: Diagnosis not present

## 2016-06-10 DIAGNOSIS — Z125 Encounter for screening for malignant neoplasm of prostate: Secondary | ICD-10-CM | POA: Diagnosis not present

## 2016-06-10 DIAGNOSIS — E784 Other hyperlipidemia: Secondary | ICD-10-CM | POA: Diagnosis not present

## 2016-06-10 DIAGNOSIS — Z1389 Encounter for screening for other disorder: Secondary | ICD-10-CM | POA: Diagnosis not present

## 2016-06-12 ENCOUNTER — Other Ambulatory Visit: Payer: Self-pay | Admitting: Internal Medicine

## 2016-06-12 DIAGNOSIS — F17201 Nicotine dependence, unspecified, in remission: Secondary | ICD-10-CM

## 2016-06-17 DIAGNOSIS — Z1212 Encounter for screening for malignant neoplasm of rectum: Secondary | ICD-10-CM | POA: Diagnosis not present

## 2016-08-23 ENCOUNTER — Ambulatory Visit
Admission: RE | Admit: 2016-08-23 | Discharge: 2016-08-23 | Disposition: A | Discharge status: Home / Self Care | Payer: BLUE CROSS/BLUE SHIELD | Source: Ambulatory Visit | Attending: Acute Care | Admitting: Acute Care

## 2016-08-23 DIAGNOSIS — Z87891 Personal history of nicotine dependence: Secondary | ICD-10-CM | POA: Diagnosis not present

## 2016-08-23 DIAGNOSIS — F17210 Nicotine dependence, cigarettes, uncomplicated: Secondary | ICD-10-CM

## 2016-08-26 ENCOUNTER — Other Ambulatory Visit: Payer: Self-pay | Admitting: Acute Care

## 2016-08-26 DIAGNOSIS — Z87891 Personal history of nicotine dependence: Secondary | ICD-10-CM

## 2017-06-09 DIAGNOSIS — Z125 Encounter for screening for malignant neoplasm of prostate: Secondary | ICD-10-CM | POA: Diagnosis not present

## 2017-06-09 DIAGNOSIS — E7849 Other hyperlipidemia: Secondary | ICD-10-CM | POA: Diagnosis not present

## 2017-06-09 DIAGNOSIS — R8290 Unspecified abnormal findings in urine: Secondary | ICD-10-CM | POA: Diagnosis not present

## 2017-06-09 DIAGNOSIS — I10 Essential (primary) hypertension: Secondary | ICD-10-CM | POA: Diagnosis not present

## 2017-06-12 DIAGNOSIS — Z1389 Encounter for screening for other disorder: Secondary | ICD-10-CM | POA: Diagnosis not present

## 2017-06-12 DIAGNOSIS — J449 Chronic obstructive pulmonary disease, unspecified: Secondary | ICD-10-CM | POA: Diagnosis not present

## 2017-06-12 DIAGNOSIS — R7301 Impaired fasting glucose: Secondary | ICD-10-CM | POA: Diagnosis not present

## 2017-06-12 DIAGNOSIS — F17201 Nicotine dependence, unspecified, in remission: Secondary | ICD-10-CM | POA: Diagnosis not present

## 2017-06-12 DIAGNOSIS — Z23 Encounter for immunization: Secondary | ICD-10-CM | POA: Diagnosis not present

## 2017-06-12 DIAGNOSIS — Z0000 Encounter for general adult medical examination without abnormal findings: Secondary | ICD-10-CM | POA: Diagnosis not present

## 2017-06-12 DIAGNOSIS — E7849 Other hyperlipidemia: Secondary | ICD-10-CM | POA: Diagnosis not present

## 2017-06-12 DIAGNOSIS — I10 Essential (primary) hypertension: Secondary | ICD-10-CM | POA: Diagnosis not present

## 2017-06-17 DIAGNOSIS — Z1212 Encounter for screening for malignant neoplasm of rectum: Secondary | ICD-10-CM | POA: Diagnosis not present

## 2017-06-25 DIAGNOSIS — Z86010 Personal history of colonic polyps: Secondary | ICD-10-CM | POA: Diagnosis not present

## 2017-07-30 DIAGNOSIS — Z86010 Personal history of colonic polyps: Secondary | ICD-10-CM | POA: Diagnosis not present

## 2017-07-30 DIAGNOSIS — D126 Benign neoplasm of colon, unspecified: Secondary | ICD-10-CM | POA: Diagnosis not present

## 2017-07-30 DIAGNOSIS — K640 First degree hemorrhoids: Secondary | ICD-10-CM | POA: Diagnosis not present

## 2017-07-30 DIAGNOSIS — K635 Polyp of colon: Secondary | ICD-10-CM | POA: Diagnosis not present

## 2017-08-05 DIAGNOSIS — K635 Polyp of colon: Secondary | ICD-10-CM | POA: Diagnosis not present

## 2017-08-05 DIAGNOSIS — D126 Benign neoplasm of colon, unspecified: Secondary | ICD-10-CM | POA: Diagnosis not present

## 2017-08-29 ENCOUNTER — Ambulatory Visit
Admission: RE | Admit: 2017-08-29 | Discharge: 2017-08-29 | Disposition: A | Discharge status: Home / Self Care | Payer: BLUE CROSS/BLUE SHIELD | Source: Ambulatory Visit | Attending: Acute Care | Admitting: Acute Care

## 2017-08-29 DIAGNOSIS — Z87891 Personal history of nicotine dependence: Secondary | ICD-10-CM | POA: Diagnosis not present

## 2017-09-04 ENCOUNTER — Other Ambulatory Visit: Payer: Self-pay | Admitting: Acute Care

## 2017-09-04 DIAGNOSIS — Z87891 Personal history of nicotine dependence: Secondary | ICD-10-CM

## 2017-09-04 DIAGNOSIS — Z122 Encounter for screening for malignant neoplasm of respiratory organs: Secondary | ICD-10-CM

## 2017-09-05 ENCOUNTER — Other Ambulatory Visit: Payer: Self-pay | Admitting: Internal Medicine

## 2017-09-05 DIAGNOSIS — F17201 Nicotine dependence, unspecified, in remission: Secondary | ICD-10-CM

## 2017-10-09 DIAGNOSIS — I10 Essential (primary) hypertension: Secondary | ICD-10-CM | POA: Diagnosis not present

## 2017-10-09 DIAGNOSIS — R600 Localized edema: Secondary | ICD-10-CM | POA: Diagnosis not present

## 2017-10-09 DIAGNOSIS — E7849 Other hyperlipidemia: Secondary | ICD-10-CM | POA: Diagnosis not present

## 2017-10-09 DIAGNOSIS — I872 Venous insufficiency (chronic) (peripheral): Secondary | ICD-10-CM | POA: Diagnosis not present

## 2018-06-10 DIAGNOSIS — E7849 Other hyperlipidemia: Secondary | ICD-10-CM | POA: Diagnosis not present

## 2018-06-10 DIAGNOSIS — R7301 Impaired fasting glucose: Secondary | ICD-10-CM | POA: Diagnosis not present

## 2018-06-10 DIAGNOSIS — R82998 Other abnormal findings in urine: Secondary | ICD-10-CM | POA: Diagnosis not present

## 2018-06-10 DIAGNOSIS — Z0000 Encounter for general adult medical examination without abnormal findings: Secondary | ICD-10-CM | POA: Diagnosis not present

## 2018-06-10 DIAGNOSIS — I10 Essential (primary) hypertension: Secondary | ICD-10-CM | POA: Diagnosis not present

## 2018-06-17 DIAGNOSIS — Z0000 Encounter for general adult medical examination without abnormal findings: Secondary | ICD-10-CM | POA: Diagnosis not present

## 2018-06-17 DIAGNOSIS — R7301 Impaired fasting glucose: Secondary | ICD-10-CM | POA: Diagnosis not present

## 2018-06-17 DIAGNOSIS — Z1331 Encounter for screening for depression: Secondary | ICD-10-CM | POA: Diagnosis not present

## 2018-06-17 DIAGNOSIS — J449 Chronic obstructive pulmonary disease, unspecified: Secondary | ICD-10-CM | POA: Diagnosis not present

## 2018-06-17 DIAGNOSIS — E7849 Other hyperlipidemia: Secondary | ICD-10-CM | POA: Diagnosis not present

## 2018-06-17 DIAGNOSIS — Z125 Encounter for screening for malignant neoplasm of prostate: Secondary | ICD-10-CM | POA: Diagnosis not present

## 2018-06-17 DIAGNOSIS — I10 Essential (primary) hypertension: Secondary | ICD-10-CM | POA: Diagnosis not present

## 2018-09-16 ENCOUNTER — Other Ambulatory Visit: Payer: Self-pay | Admitting: Internal Medicine

## 2018-09-16 DIAGNOSIS — F17211 Nicotine dependence, cigarettes, in remission: Secondary | ICD-10-CM

## 2018-10-09 ENCOUNTER — Other Ambulatory Visit: Payer: Self-pay | Admitting: Acute Care

## 2018-10-09 DIAGNOSIS — Z122 Encounter for screening for malignant neoplasm of respiratory organs: Secondary | ICD-10-CM

## 2018-10-09 DIAGNOSIS — Z87891 Personal history of nicotine dependence: Secondary | ICD-10-CM

## 2018-11-06 ENCOUNTER — Ambulatory Visit: Payer: BLUE CROSS/BLUE SHIELD

## 2019-05-31 DIAGNOSIS — H6500 Acute serous otitis media, unspecified ear: Secondary | ICD-10-CM | POA: Diagnosis not present

## 2019-05-31 DIAGNOSIS — J449 Chronic obstructive pulmonary disease, unspecified: Secondary | ICD-10-CM | POA: Diagnosis not present

## 2019-06-23 DIAGNOSIS — I10 Essential (primary) hypertension: Secondary | ICD-10-CM | POA: Diagnosis not present

## 2019-06-23 DIAGNOSIS — R7301 Impaired fasting glucose: Secondary | ICD-10-CM | POA: Diagnosis not present

## 2019-06-23 DIAGNOSIS — Z0000 Encounter for general adult medical examination without abnormal findings: Secondary | ICD-10-CM | POA: Diagnosis not present

## 2019-06-23 DIAGNOSIS — E7849 Other hyperlipidemia: Secondary | ICD-10-CM | POA: Diagnosis not present

## 2019-06-23 DIAGNOSIS — Z125 Encounter for screening for malignant neoplasm of prostate: Secondary | ICD-10-CM | POA: Diagnosis not present

## 2019-07-08 DIAGNOSIS — E785 Hyperlipidemia, unspecified: Secondary | ICD-10-CM | POA: Diagnosis not present

## 2019-07-08 DIAGNOSIS — I10 Essential (primary) hypertension: Secondary | ICD-10-CM | POA: Diagnosis not present

## 2019-07-08 DIAGNOSIS — Z1331 Encounter for screening for depression: Secondary | ICD-10-CM | POA: Diagnosis not present

## 2019-07-08 DIAGNOSIS — Z0000 Encounter for general adult medical examination without abnormal findings: Secondary | ICD-10-CM | POA: Diagnosis not present

## 2019-07-08 DIAGNOSIS — R7301 Impaired fasting glucose: Secondary | ICD-10-CM | POA: Diagnosis not present

## 2019-07-08 DIAGNOSIS — J449 Chronic obstructive pulmonary disease, unspecified: Secondary | ICD-10-CM | POA: Diagnosis not present

## 2019-07-15 ENCOUNTER — Other Ambulatory Visit: Payer: Self-pay | Admitting: Internal Medicine

## 2019-07-15 DIAGNOSIS — F17211 Nicotine dependence, cigarettes, in remission: Secondary | ICD-10-CM

## 2020-05-28 ENCOUNTER — Encounter: Payer: Self-pay | Admitting: Physician Assistant

## 2020-05-28 ENCOUNTER — Telehealth: Payer: Self-pay | Admitting: Unknown Physician Specialty

## 2020-05-28 ENCOUNTER — Other Ambulatory Visit: Payer: Self-pay | Admitting: Physician Assistant

## 2020-05-28 DIAGNOSIS — U071 COVID-19: Secondary | ICD-10-CM

## 2020-05-28 DIAGNOSIS — I2699 Other pulmonary embolism without acute cor pulmonale: Secondary | ICD-10-CM | POA: Insufficient documentation

## 2020-05-28 NOTE — Telephone Encounter (Signed)
Called to Discuss with patient about Covid symptoms and the use of the monoclonal antibody infusion for those with mild to moderate Covid symptoms and at a high risk of hospitalization.     Pt appears to qualify for this infusion due to co-morbid conditions and/or a member of an at-risk group in accordance with the FDA Emergency Use Authorization.    Unable to reach pt  LMOM and mychart 

## 2020-05-28 NOTE — Progress Notes (Signed)
I connected by phone with Jeffery Rocha on 05/28/2020 at 1:44 PM to discuss the potential use of a new treatment for mild to moderate COVID-19 viral infection in non-hospitalized patients.  This patient is a 60 y.o. male that meets the FDA criteria for Emergency Use Authorization of COVID monoclonal antibody sotrovimab.  Has a (+) direct SARS-CoV-2 viral test result  Has mild or moderate COVID-19   Is NOT hospitalized due to COVID-19  Is within 10 days of symptom onset  Has at least one of the high risk factor(s) for progression to severe COVID-19 and/or hospitalization as defined in EUA.  Specific high risk criteria : BMI > 25, Chronic Lung Disease and Other high risk medical condition per CDC:  history of PE   I have spoken and communicated the following to the patient or parent/caregiver regarding COVID monoclonal antibody treatment:  1. FDA has authorized the emergency use for the treatment of mild to moderate COVID-19 in adults and pediatric patients with positive results of direct SARS-CoV-2 viral testing who are 62 years of age and older weighing at least 40 kg, and who are at high risk for progressing to severe COVID-19 and/or hospitalization.  2. The significant known and potential risks and benefits of COVID monoclonal antibody, and the extent to which such potential risks and benefits are unknown.  3. Information on available alternative treatments and the risks and benefits of those alternatives, including clinical trials.  4. Patients treated with COVID monoclonal antibody should continue to self-isolate and use infection control measures (e.g., wear mask, isolate, social distance, avoid sharing personal items, clean and disinfect "high touch" surfaces, and frequent handwashing) according to CDC guidelines.   5. The patient or parent/caregiver has the option to accept or refuse COVID monoclonal antibody treatment.  After reviewing this information with the patient, the  patient has agreed to receive one of the available covid 19 monoclonal antibodies and will be provided an appropriate fact sheet prior to infusion.   Sx onset 1/14. He is vaccinated with [employer]. Not due for booster yet. Set up for 1/17 @ 1:30pm. He is aware his insurance will be charged and infusion fee.   Cline Crock, PA-C 05/28/2020 1:44 PM

## 2020-05-29 ENCOUNTER — Ambulatory Visit (HOSPITAL_COMMUNITY): Payer: Self-pay

## 2020-05-30 ENCOUNTER — Other Ambulatory Visit (HOSPITAL_COMMUNITY): Payer: Self-pay

## 2020-05-30 ENCOUNTER — Ambulatory Visit (HOSPITAL_COMMUNITY)
Admission: RE | Admit: 2020-05-30 | Discharge: 2020-05-30 | Disposition: A | Discharge status: Home / Self Care | Payer: BC Managed Care – PPO | Source: Ambulatory Visit | Attending: Pulmonary Disease | Admitting: Pulmonary Disease

## 2020-05-30 DIAGNOSIS — I2699 Other pulmonary embolism without acute cor pulmonale: Secondary | ICD-10-CM | POA: Diagnosis not present

## 2020-05-30 DIAGNOSIS — U071 COVID-19: Secondary | ICD-10-CM | POA: Diagnosis not present

## 2020-05-30 MED ORDER — FAMOTIDINE IN NACL 20-0.9 MG/50ML-% IV SOLN: 20.0000 mg | Freq: Once | INTRAVENOUS | Status: DC | PRN

## 2020-05-30 MED ORDER — EPINEPHRINE 0.3 MG/0.3ML IJ SOAJ: 0.3000 mg | Freq: Once | INTRAMUSCULAR | Status: DC | PRN

## 2020-05-30 MED ORDER — SOTROVIMAB 500 MG/8ML IV SOLN
500.0000 mg | Freq: Once | INTRAVENOUS | Status: AC
  Administered 2020-05-30: 500 mg via INTRAVENOUS

## 2020-05-30 MED ORDER — SODIUM CHLORIDE 0.9 % IV SOLN: INTRAVENOUS | Status: DC | PRN

## 2020-05-30 MED ORDER — DIPHENHYDRAMINE HCL 50 MG/ML IJ SOLN: 50.0000 mg | Freq: Once | INTRAMUSCULAR | Status: DC | PRN

## 2020-05-30 MED ORDER — METHYLPREDNISOLONE SODIUM SUCC 125 MG IJ SOLR: 125.0000 mg | Freq: Once | INTRAMUSCULAR | Status: DC | PRN

## 2020-05-30 MED ORDER — ALBUTEROL SULFATE HFA 108 (90 BASE) MCG/ACT IN AERS: 2.0000 | INHALATION_SPRAY | Freq: Once | RESPIRATORY_TRACT | Status: DC | PRN

## 2020-05-30 NOTE — Discharge Instructions (Signed)

## 2020-05-30 NOTE — Progress Notes (Signed)
Patient reviewed Fact Sheet for Patients, Parents, and Caregivers for Emergency Use Authorization (EUA) of Sotrovimab for the Treatment of Coronavirus. Patient also reviewed and is agreeable to the estimated cost of treatment. Patient is agreeable to proceed.   

## 2020-05-30 NOTE — Progress Notes (Signed)
Diagnosis: COVID-19  Physician: Dr. Patrick Wright  Procedure: Cov[facility] Infusion [facility] Med: Sotrovimab infusion - Provided patient with sotrovimab fact sheet for patients, parents, and caregivers prior to infusion.   Complications: No immediate complications noted  Discharge: Discharged home    

## 2020-07-14 DIAGNOSIS — Z0000 Encounter for general adult medical examination without abnormal findings: Secondary | ICD-10-CM | POA: Diagnosis not present

## 2020-07-14 DIAGNOSIS — E291 Testicular hypofunction: Secondary | ICD-10-CM | POA: Diagnosis not present

## 2020-07-14 DIAGNOSIS — E785 Hyperlipidemia, unspecified: Secondary | ICD-10-CM | POA: Diagnosis not present

## 2020-07-14 DIAGNOSIS — Z125 Encounter for screening for malignant neoplasm of prostate: Secondary | ICD-10-CM | POA: Diagnosis not present

## 2020-07-14 DIAGNOSIS — R7301 Impaired fasting glucose: Secondary | ICD-10-CM | POA: Diagnosis not present

## 2020-07-17 DIAGNOSIS — I10 Essential (primary) hypertension: Secondary | ICD-10-CM | POA: Diagnosis not present

## 2020-07-17 DIAGNOSIS — R82998 Other abnormal findings in urine: Secondary | ICD-10-CM | POA: Diagnosis not present

## 2020-07-17 DIAGNOSIS — I700 Atherosclerosis of aorta: Secondary | ICD-10-CM | POA: Diagnosis not present

## 2020-07-17 DIAGNOSIS — Z1339 Encounter for screening examination for other mental health and behavioral disorders: Secondary | ICD-10-CM | POA: Diagnosis not present

## 2020-07-17 DIAGNOSIS — Z0000 Encounter for general adult medical examination without abnormal findings: Secondary | ICD-10-CM | POA: Diagnosis not present

## 2020-07-17 DIAGNOSIS — Z1331 Encounter for screening for depression: Secondary | ICD-10-CM | POA: Diagnosis not present

## 2020-07-27 DIAGNOSIS — D225 Melanocytic nevi of trunk: Secondary | ICD-10-CM | POA: Diagnosis not present

## 2020-07-27 DIAGNOSIS — D485 Neoplasm of uncertain behavior of skin: Secondary | ICD-10-CM | POA: Diagnosis not present

## 2020-07-27 DIAGNOSIS — L720 Epidermal cyst: Secondary | ICD-10-CM | POA: Diagnosis not present

## 2020-07-27 DIAGNOSIS — L820 Inflamed seborrheic keratosis: Secondary | ICD-10-CM | POA: Diagnosis not present

## 2020-07-27 DIAGNOSIS — L821 Other seborrheic keratosis: Secondary | ICD-10-CM | POA: Diagnosis not present

## 2020-07-27 DIAGNOSIS — L918 Other hypertrophic disorders of the skin: Secondary | ICD-10-CM | POA: Diagnosis not present

## 2020-07-28 ENCOUNTER — Other Ambulatory Visit: Payer: Self-pay | Admitting: Internal Medicine

## 2020-07-28 ENCOUNTER — Other Ambulatory Visit: Payer: Self-pay | Admitting: Ophthalmology

## 2020-07-28 DIAGNOSIS — F17201 Nicotine dependence, unspecified, in remission: Secondary | ICD-10-CM

## 2021-07-31 ENCOUNTER — Other Ambulatory Visit: Payer: Self-pay | Admitting: Internal Medicine

## 2021-07-31 DIAGNOSIS — F17201 Nicotine dependence, unspecified, in remission: Secondary | ICD-10-CM

## 2021-09-06 ENCOUNTER — Inpatient Hospital Stay: Admission: RE | Admit: 2021-09-06 | Payer: BC Managed Care – PPO | Source: Ambulatory Visit

## 2021-09-28 ENCOUNTER — Ambulatory Visit
Admission: RE | Admit: 2021-09-28 | Discharge: 2021-09-28 | Disposition: A | Discharge status: Home / Self Care | Payer: No Typology Code available for payment source | Source: Ambulatory Visit | Attending: Internal Medicine | Admitting: Internal Medicine

## 2021-09-28 DIAGNOSIS — F17201 Nicotine dependence, unspecified, in remission: Secondary | ICD-10-CM

## 2022-08-15 DIAGNOSIS — Z1331 Encounter for screening for depression: Secondary | ICD-10-CM | POA: Diagnosis not present

## 2022-08-15 DIAGNOSIS — Z1339 Encounter for screening examination for other mental health and behavioral disorders: Secondary | ICD-10-CM | POA: Diagnosis not present

## 2022-08-15 DIAGNOSIS — I10 Essential (primary) hypertension: Secondary | ICD-10-CM | POA: Diagnosis not present

## 2022-08-15 DIAGNOSIS — Z0000 Encounter for general adult medical examination without abnormal findings: Secondary | ICD-10-CM | POA: Diagnosis not present

## 2022-08-15 DIAGNOSIS — R079 Chest pain, unspecified: Secondary | ICD-10-CM | POA: Diagnosis not present

## 2022-08-16 ENCOUNTER — Other Ambulatory Visit: Payer: Self-pay | Admitting: Internal Medicine

## 2022-08-16 DIAGNOSIS — R911 Solitary pulmonary nodule: Secondary | ICD-10-CM

## 2022-08-29 DIAGNOSIS — L820 Inflamed seborrheic keratosis: Secondary | ICD-10-CM | POA: Diagnosis not present

## 2022-08-29 DIAGNOSIS — D224 Melanocytic nevi of scalp and neck: Secondary | ICD-10-CM | POA: Diagnosis not present

## 2022-09-27 ENCOUNTER — Ambulatory Visit
Admission: RE | Admit: 2022-09-27 | Discharge: 2022-09-27 | Disposition: A | Discharge status: Home / Self Care | Payer: BC Managed Care – PPO | Source: Ambulatory Visit | Attending: Internal Medicine | Admitting: Internal Medicine

## 2022-09-27 DIAGNOSIS — J439 Emphysema, unspecified: Secondary | ICD-10-CM | POA: Diagnosis not present

## 2022-09-27 DIAGNOSIS — R918 Other nonspecific abnormal finding of lung field: Secondary | ICD-10-CM | POA: Diagnosis not present

## 2022-09-27 DIAGNOSIS — R911 Solitary pulmonary nodule: Secondary | ICD-10-CM

## 2022-09-29 NOTE — Progress Notes (Unsigned)
Cardiology Office Note:    Date:  09/30/2022   ID:  GAR BOYLES, DOB 02-12-1961, MRN [id number]  PCP:  Cleatis Polka., MD   [facility] Providers Cardiologist:  Aydeen Blume  Click to update primary MD,subspecialty MD or APP then REFRESH:1}    Referring MD: Cleatis Polka., MD   Chief Complaint  Patient presents with   Chest Pain   pulmonary emboli    History of Present Illness:   May 20 , 2024    Jeffery Rocha is a 62 y.o. male with a hx of chest pain, pulmonary emboli, HLD , morbid obesity   He saw Dr. Donnie Aho in the past   More short of breath for the past several months  Works in [employer] so does lots of climbing   Stopped smoking and has gained lots of weight  Perhaps 50-60 lbs over the past several years   Has occasional sharp jabs lasting 1 seconds - does not sound like angina    Past Medical History:  Diagnosis Date   Chest pain    DVT of lower limb, acute (HCC)    Gross hematuria    Hypercholesteremia    Kidney stones    Morbid obesity (HCC)    Pulmonary embolism (HCC)    Ureteral stone     No past surgical history on file.  Current Medications: Current Meds  Medication Sig   acetaminophen (TYLENOL) 500 MG tablet Take 1,000 mg by mouth every 6 (six) hours as needed for pain.   atorvastatin (LIPITOR) 20 MG tablet Take 20 mg by mouth daily.   buPROPion (WELLBUTRIN XL) 300 MG 24 hr tablet Take 300 mg by mouth daily.   ezetimibe (ZETIA) 10 MG tablet Take 10 mg by mouth daily.   sildenafil (VIAGRA) 100 MG tablet 1 pill daily as needed Oral   XARELTO 10 MG TABS tablet Take 10 mg by mouth daily.   [DISCONTINUED] buPROPion (WELLBUTRIN XL) 150 MG 24 hr tablet Take 150 mg by mouth daily.     Allergies:   Patient has no known allergies.   Social History   Socioeconomic History   Marital status: Married    Spouse name: Not on file   Number of children: Not on file   Years of education: Not on file   Highest education level: Not on  file  Occupational History   Not on file  Tobacco Use   Smoking status: Former   Smokeless tobacco: Not on file  Substance and Sexual Activity   Alcohol use: No   Drug use: No   Sexual activity: Not on file  Other Topics Concern   Not on file  Social History Narrative   Not on file   Social Determinants of Health   Financial Resource Strain: Not on file  Food Insecurity: Not on file  Transportation Needs: Not on file  Physical Activity: Not on file  Stress: Not on file  Social Connections: Not on file     Family History: The patient's family history is not on file.  ROS:   Please see the history of present illness.     All other systems reviewed and are negative.  EKGs/Labs/Other Studies Reviewed:    The following studies were reviewed today:   EKG:  Sep 30, 2022.  NSR at 86.  LAD   Recent Labs: No results found for requested labs within last 365 days.  Recent Lipid Panel No results found for: "CHOL", "TRIG", "HDL", "  CHOLHDL", "VLDL", "LDLCALC", "LDLDIRECT"   Risk Assessment/Calculations:                Physical Exam:    VS:  BP 132/86   Pulse 86   Ht 6\' 2"  (1.88 m)   Wt (!) 303 lb 12.8 oz (137.8 kg)   SpO2 96%   BMI 39.01 kg/m     Wt Readings from Last 3 Encounters:  09/30/22 (!) 303 lb 12.8 oz (137.8 kg)     GEN:  Well nourished, well developed in no acute distress HEENT: Normal NECK: No JVD; No carotid bruits LYMPHATICS: No lymphadenopathy CARDIAC: RRR, no murmurs, rubs, gallops RESPIRATORY:  Clear to auscultation without rales, wheezing or rhonchi  ABDOMEN: Soft, non-tender, non-distended MUSCULOSKELETAL:  No edema; No deformity  SKIN: Warm and dry NEUROLOGIC:  Alert and oriented x 3 PSYCHIATRIC:  Normal affect   ASSESSMENT:    1. Shortness of breath    PLAN:    In order of problems listed above:  Shortness of breath with exertion: [name] presents for further evaluation of his shortness of breath with exertion.  He has gained  50-60 lbs over the past year .Marland Kitchen His dyspnea may be due to his weight gain  Will get an echo to evaluate his LV functino   He will watch his diet,   Reduce salt,  No angina symtoms at this point to suggest ischemia            Medication Adjustments/Labs and Tests Ordered: Current medicines are reviewed at length with the patient today.  Concerns regarding medicines are outlined above.  Orders Placed This Encounter  Procedures   EKG 12-Lead   ECHOCARDIOGRAM COMPLETE   No orders of the defined types were placed in this encounter.   Patient Instructions  Medication Instructions:  Your physician recommends that you continue on your current medications as directed. Please refer to the Current Medication list given to you today.  *If you need a refill on your cardiac medications before your next appointment, please call your pharmacy*   Testing/Procedures: Your physician has requested that you have an echocardiogram. Echocardiography is a painless test that uses sound waves to create images of your heart. It provides your doctor with information about the size and shape of your heart and how well your heart's chambers and valves are working. This procedure takes approximately one hour. There are no restrictions for this procedure. Please do NOT wear cologne, perfume, aftershave, or lotions (deodorant is allowed). Please arrive 15 minutes prior to your appointment time.  Follow-Up: At [facility], you and your health needs are our priority.  As part of our continuing mission to provide you with exceptional heart care, we have created designated Provider Care Teams.  These Care Teams include your primary Cardiologist (physician) and Advanced Practice Providers (APPs -  Physician Assistants and Nurse Practitioners) who all work together to provide you with the care you need, when you need it.  Your next appointment:   3 month(s)  Provider:   Kristeen Miss, MD    Signed, Kristeen Miss, MD  09/30/2022 5:32 PM    [facility]

## 2022-09-30 ENCOUNTER — Ambulatory Visit: Payer: BC Managed Care – PPO | Attending: Cardiovascular Disease | Admitting: Cardiovascular Disease

## 2022-09-30 ENCOUNTER — Encounter: Payer: Self-pay | Admitting: Cardiovascular Disease

## 2022-09-30 VITALS — BP 132/86 | HR 86 | Ht 74.0 in | Wt 303.8 lb

## 2022-09-30 DIAGNOSIS — R0602 Shortness of breath: Secondary | ICD-10-CM

## 2022-09-30 NOTE — Patient Instructions (Signed)
Medication Instructions:  Your physician recommends that you continue on your current medications as directed. Please refer to the Current Medication list given to you today.  *If you need a refill on your cardiac medications before your next appointment, please call your pharmacy*   Testing/Procedures: Your physician has requested that you have an echocardiogram. Echocardiography is a painless test that uses sound waves to create images of your heart. It provides your doctor with information about the size and shape of your heart and how well your heart's chambers and valves are working. This procedure takes approximately one hour. There are no restrictions for this procedure. Please do NOT wear cologne, perfume, aftershave, or lotions (deodorant is allowed). Please arrive 15 minutes prior to your appointment time.  Follow-Up: At [facility], you and your health needs are our priority.  As part of our continuing mission to provide you with exceptional heart care, we have created designated Provider Care Teams.  These Care Teams include your primary Cardiologist (physician) and Advanced Practice Providers (APPs -  Physician Assistants and Nurse Practitioners) who all work together to provide you with the care you need, when you need it.  Your next appointment:   3 month(s)  Provider:   Kristeen Miss, MD

## 2022-10-31 ENCOUNTER — Ambulatory Visit (HOSPITAL_COMMUNITY): Payer: BC Managed Care – PPO | Attending: Cardiology

## 2022-10-31 DIAGNOSIS — R0602 Shortness of breath: Secondary | ICD-10-CM | POA: Insufficient documentation

## 2022-10-31 LAB — ECHOCARDIOGRAM COMPLETE
Area-P 1/2: 4.1 cm2
S' Lateral: 2.8 cm

## 2022-12-22 ENCOUNTER — Encounter: Payer: Self-pay | Admitting: Cardiovascular Disease

## 2022-12-22 NOTE — Progress Notes (Signed)
Cardiology Office Note:    Date:  12/22/2022   ID:  Jeffery Rocha, DOB Jul 20, 1960, MRN [id number]  PCP:  Cleatis Polka., MD   [facility] Providers Cardiologist:  Shaquil Aldana  Click to update primary MD,subspecialty MD or APP then REFRESH:1}    Referring MD: Cleatis Polka., MD   Chief Complaint  Patient presents with   Shortness of Breath    History of Present Illness:   May 20 , 2024    Jeffery Rocha is a 62 y.o. male with a hx of chest pain, pulmonary emboli, HLD , morbid obesity   He saw Dr. Donnie Aho in the past   More short of breath for the past several months  Works in [employer] so does lots of climbing   Stopped smoking and has gained lots of weight  Perhaps 50-60 lbs over the past several years   Has occasional sharp jabs lasting 1 seconds - does not sound like angina     Aug. 12, 2024 Jeffery Rocha is seen for follow up of his dyspnea, chest pain  , obesity , pulmnary emboli   Past Medical History:  Diagnosis Date   Chest pain    DVT of lower limb, acute (HCC)    Gross hematuria    Hypercholesteremia    Kidney stones    Morbid obesity (HCC)    Pulmonary embolism (HCC)    Ureteral stone     History reviewed. No pertinent surgical history.  Current Medications: No outpatient medications have been marked as taking for the 12/23/22 encounter (Office Visit) with Porshe Fleagle, Deloris Ping, MD.     Allergies:   Patient has no known allergies.   Social History   Socioeconomic History   Marital status: Married    Spouse name: Not on file   Number of children: Not on file   Years of education: Not on file   Highest education level: Not on file  Occupational History   Not on file  Tobacco Use   Smoking status: Former   Smokeless tobacco: Not on file  Substance and Sexual Activity   Alcohol use: No   Drug use: No   Sexual activity: Not on file  Other Topics Concern   Not on file  Social History Narrative   Not on file   Social Determinants  of Health   Financial Resource Strain: Not on file  Food Insecurity: Not on file  Transportation Needs: Not on file  Physical Activity: Not on file  Stress: Not on file  Social Connections: Not on file     Family History: The patient's family history is not on file.  ROS:   Please see the history of present illness.     All other systems reviewed and are negative.  EKGs/Labs/Other Studies Reviewed:    The following studies were reviewed today:   EKG:       Recent Labs: No results found for requested labs within last 365 days.  Recent Lipid Panel No results found for: "CHOL", "TRIG", "HDL", "CHOLHDL", "VLDL", "LDLCALC", "LDLDIRECT"   Risk Assessment/Calculations:         Physical Exam:     Physical Exam: There were no vitals taken for this visit.  No BP recorded.  {Refresh Note OR Click here to enter BP  :1}***    GEN:  Well nourished, well developed in no acute distress HEENT: Normal NECK: No JVD; No carotid bruits LYMPHATICS: No lymphadenopathy CARDIAC: RRR ***, no murmurs, rubs,  gallops RESPIRATORY:  Clear to auscultation without rales, wheezing or rhonchi  ABDOMEN: Soft, non-tender, non-distended MUSCULOSKELETAL:  No edema; No deformity  SKIN: Warm and dry NEUROLOGIC:  Alert and oriented x 3   ASSESSMENT:    No diagnosis found.  PLAN:       Shortness of breath with exertion:             Medication Adjustments/Labs and Tests Ordered: Current medicines are reviewed at length with the patient today.  Concerns regarding medicines are outlined above.  No orders of the defined types were placed in this encounter.  No orders of the defined types were placed in this encounter.   There are no Patient Instructions on file for this visit.   Signed, Kristeen Miss, MD  12/22/2022 8:06 AM    [facility]

## 2022-12-23 ENCOUNTER — Encounter: Payer: Self-pay | Admitting: Cardiovascular Disease

## 2022-12-23 ENCOUNTER — Ambulatory Visit: Payer: BC Managed Care – PPO | Attending: Cardiovascular Disease | Admitting: Cardiovascular Disease

## 2023-04-01 ENCOUNTER — Other Ambulatory Visit: Payer: Self-pay | Admitting: Internal Medicine

## 2023-04-01 DIAGNOSIS — R911 Solitary pulmonary nodule: Secondary | ICD-10-CM

## 2023-08-11 DIAGNOSIS — N401 Enlarged prostate with lower urinary tract symptoms: Secondary | ICD-10-CM | POA: Diagnosis not present

## 2023-08-11 DIAGNOSIS — R7301 Impaired fasting glucose: Secondary | ICD-10-CM | POA: Diagnosis not present

## 2023-08-11 DIAGNOSIS — E291 Testicular hypofunction: Secondary | ICD-10-CM | POA: Diagnosis not present

## 2023-08-11 DIAGNOSIS — E785 Hyperlipidemia, unspecified: Secondary | ICD-10-CM | POA: Diagnosis not present

## 2023-08-19 DIAGNOSIS — I10 Essential (primary) hypertension: Secondary | ICD-10-CM | POA: Diagnosis not present

## 2023-08-19 DIAGNOSIS — Z1331 Encounter for screening for depression: Secondary | ICD-10-CM | POA: Diagnosis not present

## 2023-08-19 DIAGNOSIS — R82998 Other abnormal findings in urine: Secondary | ICD-10-CM | POA: Diagnosis not present

## 2023-08-19 DIAGNOSIS — Z0000 Encounter for general adult medical examination without abnormal findings: Secondary | ICD-10-CM | POA: Diagnosis not present

## 2023-08-19 DIAGNOSIS — Z1339 Encounter for screening examination for other mental health and behavioral disorders: Secondary | ICD-10-CM | POA: Diagnosis not present

## 2023-08-27 ENCOUNTER — Telehealth (HOSPITAL_COMMUNITY): Payer: Self-pay

## 2023-08-27 NOTE — Telephone Encounter (Signed)
 Attempted to contact the patient to schedule VAS [place].  Yes answer.  Left message.  First Attempt. Provided direct contact number for scheduling: [phone].

## 2023-09-24 ENCOUNTER — Other Ambulatory Visit (HOSPITAL_COMMUNITY): Payer: Self-pay | Admitting: Internal Medicine

## 2023-09-24 DIAGNOSIS — I739 Peripheral vascular disease, unspecified: Secondary | ICD-10-CM

## 2023-09-25 ENCOUNTER — Ambulatory Visit (HOSPITAL_COMMUNITY)
Admission: RE | Admit: 2023-09-25 | Discharge: 2023-09-25 | Disposition: A | Discharge status: Home / Self Care | Source: Ambulatory Visit | Attending: Vascular Surgery | Admitting: Vascular Surgery

## 2023-09-25 DIAGNOSIS — I739 Peripheral vascular disease, unspecified: Secondary | ICD-10-CM

## 2023-09-29 DIAGNOSIS — R911 Solitary pulmonary nodule: Secondary | ICD-10-CM | POA: Diagnosis not present

## 2023-10-13 ENCOUNTER — Ambulatory Visit (HOSPITAL_COMMUNITY)
Admission: RE | Admit: 2023-10-13 | Discharge: 2023-10-13 | Disposition: A | Discharge status: Home / Self Care | Source: Ambulatory Visit | Attending: Internal Medicine | Admitting: Internal Medicine

## 2023-10-13 DIAGNOSIS — I739 Peripheral vascular disease, unspecified: Secondary | ICD-10-CM

## 2023-10-15 ENCOUNTER — Other Ambulatory Visit: Payer: Self-pay | Admitting: Internal Medicine

## 2023-10-15 DIAGNOSIS — J849 Interstitial pulmonary disease, unspecified: Secondary | ICD-10-CM

## 2023-10-24 ENCOUNTER — Ambulatory Visit
Admission: RE | Admit: 2023-10-24 | Discharge: 2023-10-24 | Disposition: A | Discharge status: Home / Self Care | Source: Ambulatory Visit | Attending: Internal Medicine | Admitting: Internal Medicine

## 2023-10-24 DIAGNOSIS — J849 Interstitial pulmonary disease, unspecified: Secondary | ICD-10-CM

## 2023-10-28 ENCOUNTER — Emergency Department (HOSPITAL_COMMUNITY)
Admission: EM | Admit: 2023-10-28 | Discharge: 2023-10-28 | Disposition: A | Discharge status: Home / Self Care | Attending: Emergency Medicine | Admitting: Emergency Medicine

## 2023-10-28 ENCOUNTER — Encounter (HOSPITAL_COMMUNITY): Payer: Self-pay

## 2023-10-28 ENCOUNTER — Emergency Department (HOSPITAL_COMMUNITY)

## 2023-10-28 ENCOUNTER — Other Ambulatory Visit: Payer: Self-pay

## 2023-10-28 DIAGNOSIS — H547 Unspecified visual loss: Secondary | ICD-10-CM | POA: Diagnosis not present

## 2023-10-28 DIAGNOSIS — R42 Dizziness and giddiness: Secondary | ICD-10-CM | POA: Diagnosis not present

## 2023-10-28 DIAGNOSIS — R519 Headache, unspecified: Secondary | ICD-10-CM | POA: Diagnosis not present

## 2023-10-28 DIAGNOSIS — R55 Syncope and collapse: Secondary | ICD-10-CM | POA: Diagnosis not present

## 2023-10-28 DIAGNOSIS — I6521 Occlusion and stenosis of right carotid artery: Secondary | ICD-10-CM | POA: Diagnosis not present

## 2023-10-28 DIAGNOSIS — I2510 Atherosclerotic heart disease of native coronary artery without angina pectoris: Secondary | ICD-10-CM | POA: Diagnosis not present

## 2023-10-28 DIAGNOSIS — J439 Emphysema, unspecified: Secondary | ICD-10-CM | POA: Diagnosis not present

## 2023-10-28 DIAGNOSIS — I6523 Occlusion and stenosis of bilateral carotid arteries: Secondary | ICD-10-CM | POA: Diagnosis not present

## 2023-10-28 DIAGNOSIS — I6782 Cerebral ischemia: Secondary | ICD-10-CM | POA: Diagnosis not present

## 2023-10-28 DIAGNOSIS — G9389 Other specified disorders of brain: Secondary | ICD-10-CM | POA: Diagnosis not present

## 2023-10-28 LAB — CBC WITH DIFFERENTIAL/PLATELET
Abs Immature Granulocytes: 0.03 10*3/uL (ref 0.00–0.07)
Basophils Absolute: 0.1 10*3/uL (ref 0.0–0.1)
Basophils Relative: 1 %
Eosinophils Absolute: 0.1 10*3/uL (ref 0.0–0.5)
Eosinophils Relative: 2 %
HCT: 42.8 % (ref 39.0–52.0)
Hemoglobin: 13.9 g/dL (ref 13.0–17.0)
Immature Granulocytes: 0 %
Lymphocytes Relative: 19 %
Lymphs Abs: 1.3 10*3/uL (ref 0.7–4.0)
MCH: 28.4 pg (ref 26.0–34.0)
MCHC: 32.5 g/dL (ref 30.0–36.0)
MCV: 87.5 fL (ref 80.0–100.0)
Monocytes Absolute: 0.4 10*3/uL (ref 0.1–1.0)
Monocytes Relative: 6 %
Neutro Abs: 4.9 10*3/uL (ref 1.7–7.7)
Neutrophils Relative %: 72 %
Platelets: 234 10*3/uL (ref 150–400)
RBC: 4.89 MIL/uL (ref 4.22–5.81)
RDW: 14 % (ref 11.5–15.5)
WBC: 6.8 10*3/uL (ref 4.0–10.5)
nRBC: 0 % (ref 0.0–0.2)

## 2023-10-28 LAB — TROPONIN I (HIGH SENSITIVITY)
Troponin I (High Sensitivity): <2 ng/L (ref <18)
Troponin I (High Sensitivity): 3 ng/L (ref <18)

## 2023-10-28 LAB — COMPREHENSIVE METABOLIC PANEL WITH GFR
ALT: 28 U/L (ref 0–44)
AST: 23 U/L (ref 15–41)
Albumin: 3.8 g/dL (ref 3.5–5.0)
Alkaline Phosphatase: 109 U/L (ref 38–126)
Anion gap: 9 (ref 5–15)
BUN: 10 mg/dL (ref 8–23)
CO2: 24 mmol/L (ref 22–32)
Calcium: 9.1 mg/dL (ref 8.9–10.3)
Chloride: 104 mmol/L (ref 98–111)
Creatinine, Ser: 0.88 mg/dL (ref 0.61–1.24)
GFR, Estimated: >60 mL/min (ref >60)
Glucose, Bld: 172 mg/dL — ABNORMAL HIGH (ref 70–99)
Potassium: 4.3 mmol/L (ref 3.5–5.1)
Sodium: 137 mmol/L (ref 135–145)
Total Bilirubin: 0.8 mg/dL (ref 0.0–1.2)
Total Protein: 7.2 g/dL (ref 6.5–8.1)

## 2023-10-28 MED ORDER — IOHEXOL 350 MG/ML SOLN
150.0000 mL | Freq: Once | INTRAVENOUS | Status: AC | PRN
  Administered 2023-10-28: 150 mL via INTRAVENOUS

## 2023-10-28 NOTE — ED Triage Notes (Signed)
 Pt states he was sitting at computer this morning and felt like he was going to pass out. Pt states his vision was gone and then it came back and occurred twice. Pt states vision has improved at this time, feels like his vision is blurry at this time. Pt does have a headache. Pt takes xarelto. Pt had chest pain last night.

## 2023-10-28 NOTE — ED Provider Notes (Signed)
 [place] EMERGENCY DEPARTMENT AT [facility] Provider Note   CSN: [id number] Arrival date & time: 10/28/23  1203     Patient presents with: Dizziness and Near Syncope   Jeffery Rocha is a 63 y.o. male.   The history is provided by the patient and medical records. No language interpreter was used.  Dizziness Quality:  Lightheadedness Severity:  Severe Onset quality:  Sudden Timing:  Sporadic Progression:  Resolved Chronicity:  New Relieved by:  Fluids Worsened by:  Nothing Ineffective treatments:  None tried Associated symptoms: chest pain (resolved) and vision changes   Associated symptoms: no blood in stool, no diarrhea, no headaches, no nausea, no palpitations, no shortness of breath, no syncope, no vomiting and no weakness   Near Syncope Associated symptoms include chest pain (resolved). Pertinent negatives include no abdominal pain, no headaches and no shortness of breath.       Prior to Admission medications   Medication Sig Start Date End Date Taking? Authorizing Provider  acetaminophen (TYLENOL) 500 MG tablet Take 1,000 mg by mouth every 6 (six) hours as needed for pain.    [provider]  atorvastatin (LIPITOR) 20 MG tablet Take 20 mg by mouth daily.    [provider]  buPROPion (WELLBUTRIN XL) 300 MG 24 hr tablet Take 300 mg by mouth daily.    [provider]  co-enzyme Q-10 30 MG capsule Take 30 mg by mouth daily. Patient not taking: Reported on 09/30/2022    [provider]  ezetimibe (ZETIA) 10 MG tablet Take 10 mg by mouth daily.    [provider]  sildenafil (VIAGRA) 100 MG tablet 1 pill daily as needed Oral 06/12/17   [provider]  tamsulosin  (FLOMAX ) 0.4 MG CAPS capsule 1 capsule Orally Once a day at bedtime Patient not taking: Reported on 09/30/2022    [provider]  Testosterone  (AXIRON ) 30 MG/ACT SOLN Place 30 mg onto the skin daily.  Patient not taking: Reported on  09/30/2022    [provider]  XARELTO 10 MG TABS tablet Take 10 mg by mouth daily.    [provider]    Allergies: Patient has no known allergies.    Review of Systems  Constitutional:  Positive for fatigue. Negative for chills and fever.  HENT:  Negative for congestion.   Respiratory:  Negative for cough, chest tightness, shortness of breath and wheezing.   Cardiovascular:  Positive for chest pain (resolved) and near-syncope. Negative for palpitations, leg swelling and syncope.  Gastrointestinal:  Negative for abdominal pain, blood in stool, diarrhea, nausea and vomiting.  Genitourinary:  Negative for dysuria and flank pain.  Musculoskeletal:  Negative for back pain and neck pain.  Skin:  Negative for wound.  Neurological:  Positive for light-headedness. Negative for dizziness, seizures, syncope, weakness and headaches.  Psychiatric/Behavioral:  Negative for agitation and confusion.     Updated Vital Signs BP 130/77 (BP Location: Left Arm)   Pulse 69   Temp 98.1 F (36.7 C) (Oral)   Resp 16   Ht 6' 2 (1.88 m)   Wt (!) 141.5 kg   SpO2 100%   BMI 40.06 kg/m   Physical Exam Vitals and nursing note reviewed.  Constitutional:      General: He is not in acute distress.    Appearance: He is well-developed. He is not ill-appearing, toxic-appearing or diaphoretic.  HENT:     Head: Normocephalic and atraumatic.     Nose: No congestion.  Mouth/Throat:     Mouth: Mucous membranes are moist.     Pharynx: No oropharyngeal exudate or posterior oropharyngeal erythema.   Eyes:     Extraocular Movements: Extraocular movements intact.     Conjunctiva/sclera: Conjunctivae normal.     Pupils: Pupils are equal, round, and reactive to light.    Cardiovascular:     Rate and Rhythm: Normal rate and regular rhythm.     Pulses: Normal pulses.     Heart sounds: No murmur heard. Pulmonary:     Effort: Pulmonary effort is normal. No respiratory distress.     Breath  sounds: Normal breath sounds. No wheezing, rhonchi or rales.  Chest:     Chest wall: No tenderness.  Abdominal:     Palpations: Abdomen is soft.     Tenderness: There is no abdominal tenderness. There is no right CVA tenderness, left CVA tenderness, guarding or rebound.   Musculoskeletal:        General: No swelling or tenderness.     Cervical back: Neck supple.     Right lower leg: No edema.     Left lower leg: No edema.   Skin:    General: Skin is warm and dry.     Capillary Refill: Capillary refill takes less than 2 seconds.     Findings: No erythema.   Neurological:     General: No focal deficit present.     Mental Status: He is alert.     Sensory: No sensory deficit.     Motor: No weakness.   Psychiatric:        Mood and Affect: Mood normal.     (all labs ordered are listed, but only abnormal results are displayed) Labs Reviewed  COMPREHENSIVE METABOLIC PANEL WITH GFR - Abnormal; Notable for the following components:      Result Value   Glucose, Bld 172 (*)    All other components within normal limits  CBC WITH DIFFERENTIAL/PLATELET  BRAIN NATRIURETIC PEPTIDE  TSH  TROPONIN I (HIGH SENSITIVITY)  TROPONIN I (HIGH SENSITIVITY)    EKG: EKG Interpretation Date/Time:  Tuesday October 28 2023 18:22:02 EDT Ventricular Rate:  76 PR Interval:  224 QRS Duration:  99 QT Interval:  404 QTC Calculation: 455 R Axis:   18  Text Interpretation: Sinus rhythm Prolonged PR interval when compared to prior, less PVC No STEMI Confirmed by Wynell Heath ([id number]) on 10/28/2023 7:33:13 PM  Radiology: CT ANGIO HEAD NECK W WO CM Result Date: 10/28/2023 CLINICAL DATA:  Lightheaded, vision loss, hx of DVT and PE. same Sx of prior PE. also some headache EXAM: CT ANGIOGRAPHY HEAD AND NECK WITH AND WITHOUT CONTRAST TECHNIQUE: Multidetector CT imaging of the head and neck was performed using the standard protocol during bolus administration of intravenous contrast. Multiplanar CT image  reconstructions and MIPs were obtained to evaluate the vascular anatomy. Carotid stenosis measurements (when applicable) are obtained utilizing NASCET criteria, using the distal internal carotid diameter as the denominator. RADIATION DOSE REDUCTION: This exam was performed according to the departmental dose-optimization program which includes automated exposure control, adjustment of the mA and/or kV according to patient size and/or use of iterative reconstruction technique. CONTRAST:  OMNIPAQUE  IOHEXOL  350 MG/ML SOLN COMPARISON:  CT head from earlier today. FINDINGS: CT HEAD FINDINGS Brain: No evidence of acute infarction, hemorrhage, hydrocephalus, extra-axial collection or mass lesion/mass effect. Vascular: See below. Skull: No acute fracture. Sinuses/Orbits: Mild-to-moderate paranasal sinus mucosal thickening. No acute orbital findings. Other: No mastoid effusions. Review of  the MIP images confirms the above findings CTA NECK FINDINGS Limited evaluation due to venous contrast bolus timing. Within this limitation: Aortic arch: Great vessel origins are patent. Aortic atherosclerosis. Right carotid system: Atherosclerosis at the carotid bifurcation and involving the proximal ICA with approximately 40% stenosis. Left carotid system: Atherosclerosis at the carotid bifurcation without greater than 50% stenosis. Vertebral arteries: Left dominant. No evidence of dissection, stenosis (50% or greater), or occlusion. Skeleton: No acute abnormality on limited assessment. Other neck: No acute abnormality on limited assessment. Upper chest: Visualized lung apices are clear.  Emphysema. Review of the MIP images confirms the above findings CTA HEAD FINDINGS Limited evaluation due to venous contrast bolus timing. Within this limitation: Anterior circulation: Bilateral intracranial ICAs, MCAs, and ACAs are patent without proximal hemodynamically significant stenosis. Posterior circulation: Bilateral intradural vertebral  arteries, basilar artery and bilateral posterior cerebral arteries are patent without proximal hemodynamically significant stenosis. Venous sinuses: As permitted by contrast timing, patent. Review of the MIP images confirms the above findings IMPRESSION: 1. Limited study without emergent large vessel occlusion or proximal high-grade stenosis. 2. Approximately 40% stenosis of the proximal right ICA. 3. Aortic Atherosclerosis (ICD10-I70.0) and Emphysema (ICD10-J43.9). Electronically Signed   By: Stevenson Elbe M.D.   On: 10/28/2023 21:29   CT Angio Chest PE W and/or Wo Contrast Result Date: 10/28/2023 CLINICAL DATA:  Lightheaded with vision loss. EXAM: CT ANGIOGRAPHY CHEST WITH CONTRAST TECHNIQUE: Multidetector CT imaging of the chest was performed using the standard protocol during bolus administration of intravenous contrast. Multiplanar CT image reconstructions and MIPs were obtained to evaluate the vascular anatomy. RADIATION DOSE REDUCTION: This exam was performed according to the departmental dose-optimization program which includes automated exposure control, adjustment of the mA and/or kV according to patient size and/or use of iterative reconstruction technique. CONTRAST:  OMNIPAQUE  IOHEXOL  350 MG/ML SOLN COMPARISON:  None Available. FINDINGS: Cardiovascular: There is mild calcification of the aortic arch. The pulmonary arteries are limited in evaluation secondary to suboptimal opacification with intravenous contrast. No evidence of pulmonary embolism. Normal heart size with marked severity coronary artery calcification. No pericardial effusion. Mediastinum/Nodes: No enlarged mediastinal, hilar, or axillary lymph nodes. Thyroid gland, trachea, and esophagus demonstrate no significant findings. Lungs/Pleura: There is mild to moderate severity emphysematous lung disease involving the bilateral upper lobes. Mild atelectatic changes are seen within the posterior aspects of the bilateral lung bases. No  acute infiltrate, pleural effusion or pneumothorax is identified. Upper Abdomen: There is diffuse fatty infiltration of the liver parenchyma. Musculoskeletal: A 2.2 cm diameter cyst is seen below the skin of the posterior right shoulder. No acute osseous abnormalities are identified. Review of the MIP images confirms the above findings. IMPRESSION: 1. Limited evaluation of the pulmonary arteries secondary to suboptimal opacification with intravenous contrast. No evidence of pulmonary embolism. 2. Mild to moderate severity emphysematous lung disease. 3. Marked severity coronary artery calcification. 4. Hepatic steatosis. 5. 2.2 cm diameter cyst below the skin of the posterior right shoulder, likely consistent with a sebaceous cyst. 6. Aortic atherosclerosis. Electronically Signed   By: Virgle Grime M.D.   On: 10/28/2023 21:12   CT Head Wo Contrast Result Date: 10/28/2023 CLINICAL DATA:  Headache. EXAM: CT HEAD WITHOUT CONTRAST TECHNIQUE: Contiguous axial images were obtained from the base of the skull through the vertex without intravenous contrast. RADIATION DOSE REDUCTION: This exam was performed according to the departmental dose-optimization program which includes automated exposure control, adjustment of the mA and/or kV according to patient size and/or use  of iterative reconstruction technique. COMPARISON:  None Available. FINDINGS: Brain: There is generalized cerebral atrophy with widening of the extra-axial spaces and ventricular dilatation. There are areas of decreased attenuation within the white matter tracts of the supratentorial brain, consistent with microvascular disease changes. Vascular: Mild to moderate severity bilateral cavernous carotid artery calcification is noted. Skull: Normal. Negative for fracture or focal lesion. Sinuses/Orbits: There is mild left maxillary sinus and sphenoid sinus mucosal thickening. Moderate to marked severity bilateral ethmoid sinus mucosal thickening is also  seen. Other: None. IMPRESSION: 1. Generalized cerebral atrophy with chronic white matter small vessel ischemic changes. 2. No acute intracranial abnormality. 3. Paranasal sinus disease, as described above. Electronically Signed   By: Virgle Grime M.D.   On: 10/28/2023 16:19     Procedures   Medications Ordered in the ED  iohexol  (OMNIPAQUE ) 350 MG/ML injection 150 mL (150 mLs Intravenous Contrast Given 10/28/23 2055)                                    Medical Decision Making Amount and/or Complexity of Data Reviewed Labs: ordered. Radiology: ordered.  Risk Prescription drug management.    Jeffery Rocha is a 63 y.o. male with a past medical history significant for obesity, previous DVT and previous pulm embolism who presents with near syncope and chest pain yesterday.  Cording to patient, he had some brief chest pain last night laying in bed but then was otherwise was feeling back to normal.  He reports that at work today he was doing email and his vision went blurry bilaterally and he got very lightheaded.  He was near syncopal.  He reports he drank some water and started feel better but then it happened a second time prompting him to seek evaluation.  He reports that these are the exact symptoms he had when he had his previous pulmonary embolism with lightheadedness without significant chest pain or shortness of breath.  He otherwise reports no nausea, vomiting, constipation, diarrhea, or urinary changes.  Otherwise he thought he could be slightly dehydrated and thought his sugar could have been low although he did not check them.  On my exam, lungs were clear and chest was nontender.  Abdomen nontender.  Legs were minimally edematous but was not bothering patient.  Denies long travel or recent flights.  Otherwise intact pulses in extremities.  No focal neurologic deficits on exam.  Pupil symmetric and reactive with normal extract movements.  Patient had a CT head in triage that  was reassuring however we had a discussion about further workup.  With this transient vision change and near syncope, we discussed getting a CT of the head and neck to look for stenosis and also discussed getting a CT PE study as the symptoms were identical reportedly to when he last had a blood clot.  Patient agreed with waiting on this.  Patient's delta troponin was negative.  His CT PE study did not show blood clot nor did show critical stenosis in his head or neck.  There was a right proximal ICA 40% stenosis and I spoke with neurology who recommended outpatient follow-up for this.  Otherwise workup reassuring.  There was a small nodule on the CT scan we discussed and he will follow-up for this.  Patient's labs otherwise reassuring.  We discussed getting other labs including BMP and TSH but patient was here for over 10 hours with reassuring vital  signs and he would like to go home.  Patient can follow-up with his PCP for further workup and understood return precautions.  He was questions or concerns and was discharged in good condition.      Final diagnoses:  Near syncope  Lightheadedness    ED Discharge Orders     None       Clinical Impression: 1. Near syncope   2. Lightheadedness     Disposition: Discharge  Condition: Good  I have discussed the results, Dx and Tx plan with the pt(& family if present). He/she/they expressed understanding and agree(s) with the plan. Discharge instructions discussed at great length. Strict return precautions discussed and pt &/or family have verbalized understanding of the instructions. No further questions at time of discharge.    Discharge Medication List as of 10/28/2023 10:19 PM      Follow Up: Jeannine Milroy., MD [address] [place] [address] [phone]     [facility] Emergency Department at [facility] [address] [place]  [address] [phone]       Candice Tobey, Marine Sia, MD 10/29/23 0000

## 2023-10-28 NOTE — Discharge Instructions (Signed)
 Your history, exam, workup today did not reveal evidence of acute stroke, bleed, or blood clot in your lungs.  Your labs are overall reassuring and I suspect you are slightly dehydrated leading to these near syncopal or lightheaded episodes.  We feel you are safe for discharge home and your vital signs were reassuring for approximately 10 hours of being here in the emergency department.  Please follow-up with your primary doctor and rest and stay hydrated.  Any symptoms change or worsen acutely, please return to the nearest emergency department.

## 2023-10-28 NOTE — ED Provider Triage Note (Signed)
 Emergency Medicine Provider Triage Evaluation Note  Jeffery Rocha , a 63 y.o. male  was evaluated in triage.  Pt complains of near syncope.  Review of Systems  Positive: Vision change, near syncope Negative: LOC, vomiting  Physical Exam  BP (!) 143/85 (BP Location: Left Arm)   Pulse 74   Temp (!) 97.5 F (36.4 C)   Resp 19   Ht 6' 2 (1.88 m)   Wt (!) 141.5 kg   SpO2 99%   BMI 40.06 kg/m  Gen:   Awake, no distress   Resp:  Normal effort  MSK:   Moves extremities without difficulty Other:    Medical Decision Making  Medically screening exam initiated at 2:32 PM.  Appropriate orders placed.  Jeffery Rocha was informed that the remainder of the evaluation will be completed by another provider, this initial triage assessment does not replace that evaluation, and the importance of remaining in the ED until their evaluation is complete.  Patient with minimal chest pain and SOB yesterday. Today while at his computer he developed symptoms of vision went dark and he felt he was about to pass out. He got up, walked, drank water and improved. Symptoms recurred shortly after prompting ED evaluation. Continues to have mild headache and blurry vision.   Jeffery Second, PA-C 10/28/23 1433

## 2023-11-22 IMAGING — CT CT CHEST LUNG CANCER SCREENING LOW DOSE W/O CM
1 of 2 series · 10 of 20 positions shown, 13 images · non-contrast
Comparison: 08/29/2017

CLINICAL DATA: Lung cancer screening. Thirty-eight pack-year
smoking history. Former asymptomatic smoker.



[ct lung segmentation data · axial · 0.95mm/px · z∈[-427,-427]mm · 10 of 378 frames shown]
[frame 1/378  mediastinal]
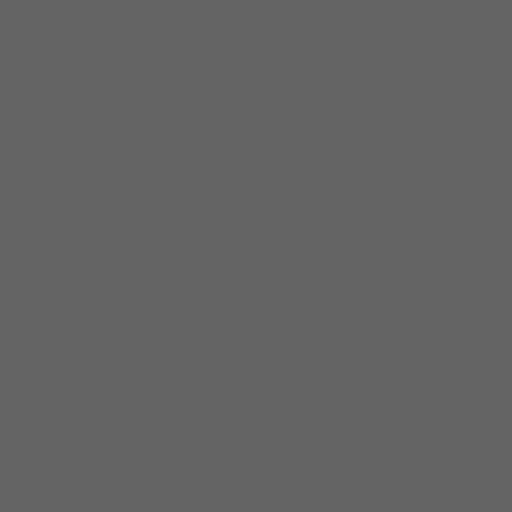
[frame 1/378  lung]
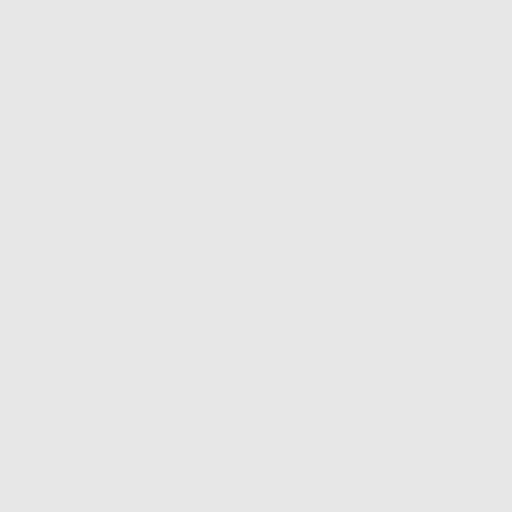
[frame 42/378  lung]
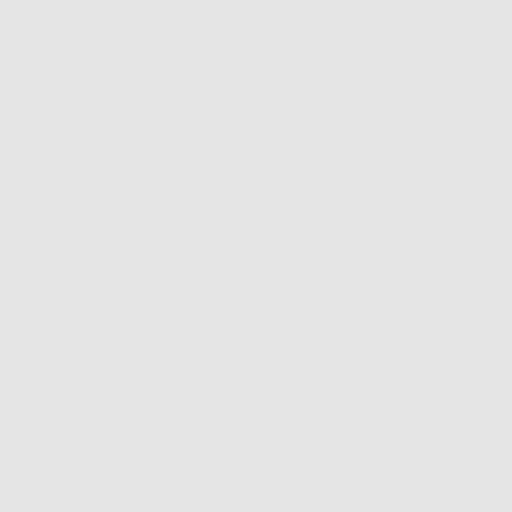
[frame 84/378  lung]
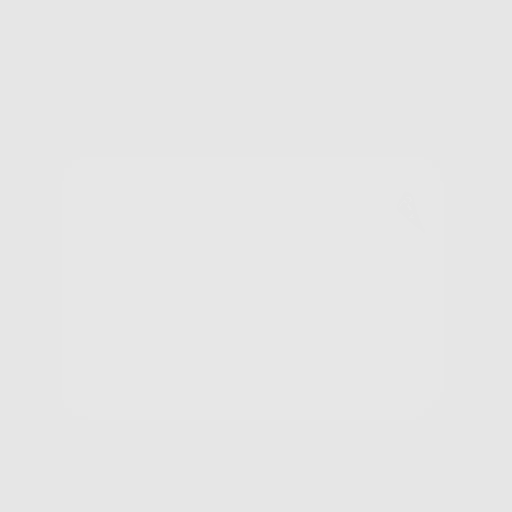
[frame 126/378  lung]
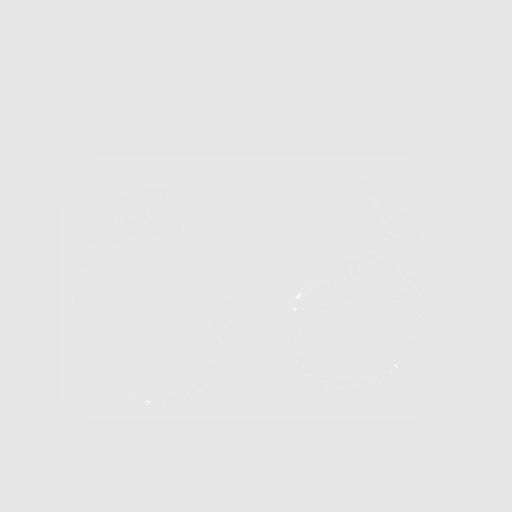
[frame 168/378  mediastinal]
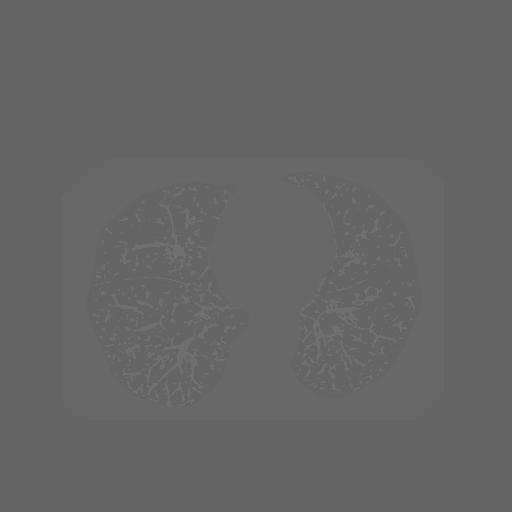
[frame 168/378  lung]
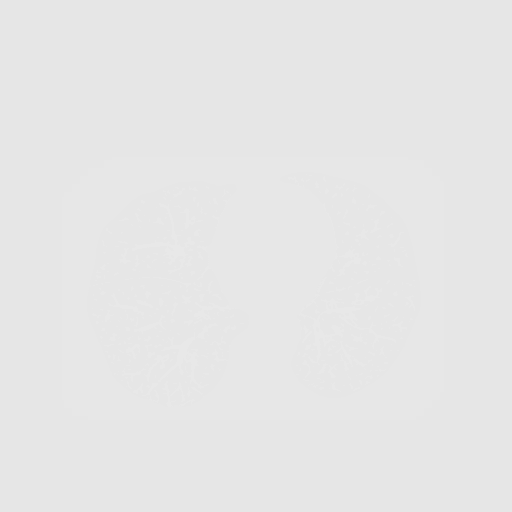
[frame 210/378  lung]
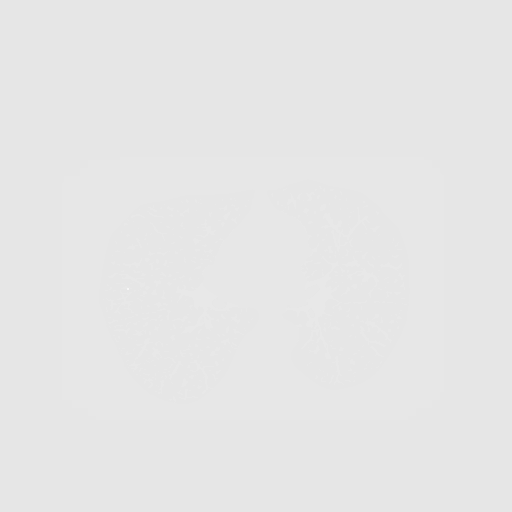
[frame 252/378  lung]
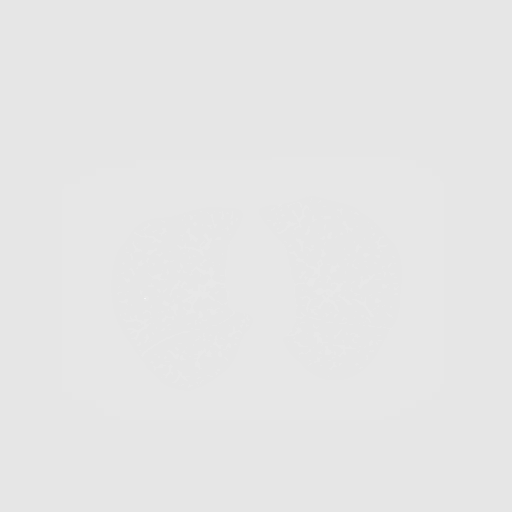
[frame 294/378  lung]
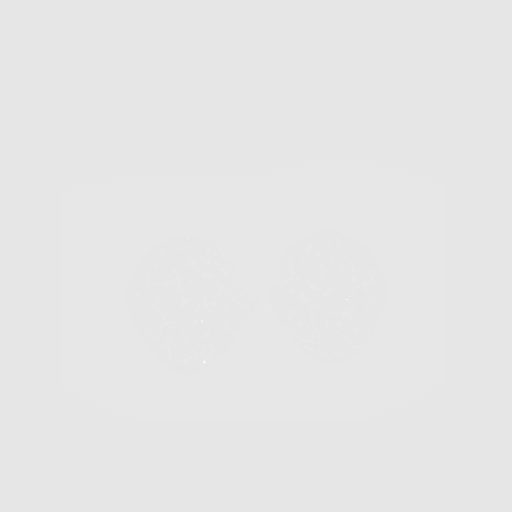
[frame 336/378  mediastinal]
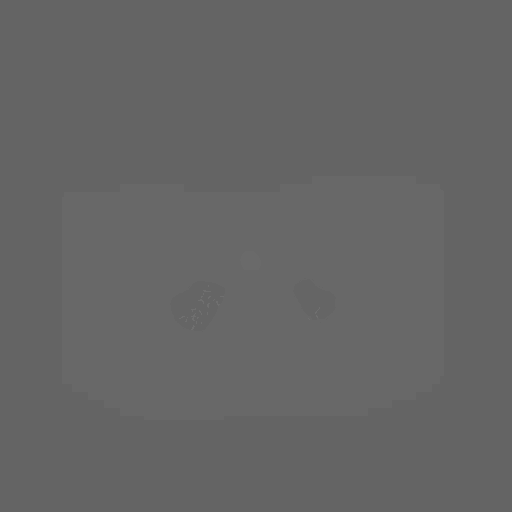
[frame 336/378  lung]
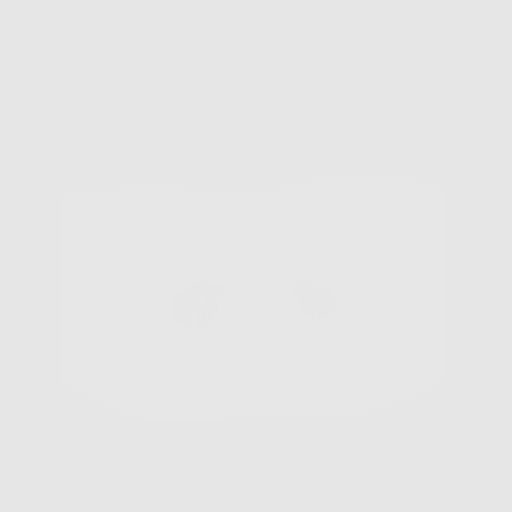
[frame 378/378  lung]
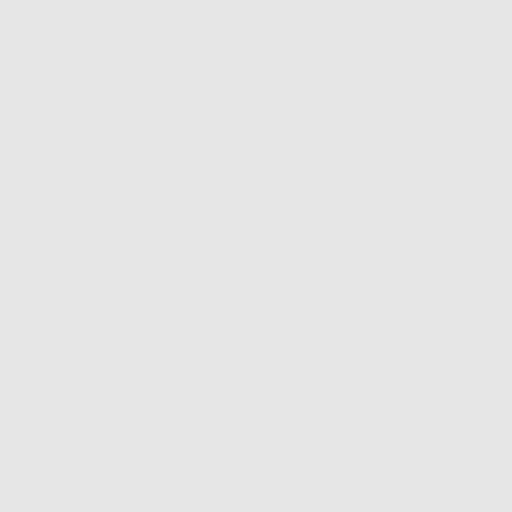

[10 of 20 positions shown; findings below may reference images not displayed]

FINDINGS: Cardiovascular: Heart size is normal. No pericardial effusion.
Aortic atherosclerosis and coronary artery calcifications.

Mediastinum/Nodes: No enlarged mediastinal, hilar, or axillary lymph
nodes. Thyroid gland, trachea, and esophagus demonstrate no
significant findings.

Lungs/Pleura: No pleural effusion, airspace consolidation,
atelectasis, or pneumothorax. Centrilobular and paraseptal
emphysema. The previously noted lung nodules are stable in the
interval. There is a new subpleural nodule within the posterior
right upper lobe along the major fissure which has a mean derived
diameter of 5.4 mm.

Upper Abdomen: No acute abnormality.

Musculoskeletal: No chest wall mass or suspicious bone lesions
identified.
IMPRESSION: 1. Lung-RADS 3, probably benign findings. Short-term follow-up in 6
months is recommended with repeat low-dose chest CT without contrast
(please use the following order, "CT CHEST LCS NODULE FOLLOW-UP W/O
CM").
2. Aortic Atherosclerosis (QVTZB-JA3.3) and Emphysema (QVTZB-TRB.9).

## 2023-12-16 DIAGNOSIS — R490 Dysphonia: Secondary | ICD-10-CM | POA: Diagnosis not present

## 2024-05-10 ENCOUNTER — Other Ambulatory Visit: Payer: Self-pay | Admitting: Internal Medicine

## 2024-05-10 DIAGNOSIS — R911 Solitary pulmonary nodule: Secondary | ICD-10-CM

## 2024-05-21 ENCOUNTER — Other Ambulatory Visit
